# Patient Record
Sex: Female | Born: 1970 | Race: Black or African American | Hispanic: No | Marital: Married | Smoking: Never smoker
Health system: Southern US, Community
[De-identification: ages and names within clinical notes are randomized; demographics above are authoritative.]

## PROBLEM LIST (undated history)

## (undated) DIAGNOSIS — R0600 Dyspnea, unspecified: Secondary | ICD-10-CM

## (undated) DIAGNOSIS — R9431 Abnormal electrocardiogram [ECG] [EKG]: Secondary | ICD-10-CM

## (undated) DIAGNOSIS — R079 Chest pain, unspecified: Secondary | ICD-10-CM

## (undated) DIAGNOSIS — K219 Gastro-esophageal reflux disease without esophagitis: Secondary | ICD-10-CM

## (undated) DIAGNOSIS — D649 Anemia, unspecified: Secondary | ICD-10-CM

## (undated) DIAGNOSIS — D259 Leiomyoma of uterus, unspecified: Secondary | ICD-10-CM

## (undated) HISTORY — PX: NO PAST SURGERIES: SHX2092

## (undated) HISTORY — DX: Chest pain, unspecified: R07.9

## (undated) HISTORY — PX: UPPER GI ENDOSCOPY: SHX6162

## (undated) HISTORY — DX: Gastro-esophageal reflux disease without esophagitis: K21.9

## (undated) HISTORY — DX: Leiomyoma of uterus, unspecified: D25.9

## (undated) HISTORY — DX: Abnormal electrocardiogram (ECG) (EKG): R94.31

## (undated) HISTORY — DX: Dyspnea, unspecified: R06.00

## (undated) HISTORY — PX: COLONOSCOPY: SHX174

## (undated) HISTORY — DX: Anemia, unspecified: D64.9

---

## 2006-11-17 ENCOUNTER — Emergency Department (HOSPITAL_COMMUNITY): Admission: EM | Admit: 2006-11-17 | Discharge: 2006-11-17 | Payer: Self-pay | Admitting: Emergency Medicine

## 2012-09-07 DIAGNOSIS — R9431 Abnormal electrocardiogram [ECG] [EKG]: Secondary | ICD-10-CM

## 2012-09-07 HISTORY — DX: Abnormal electrocardiogram (ECG) (EKG): R94.31

## 2012-09-16 ENCOUNTER — Ambulatory Visit: Payer: BC Managed Care – PPO

## 2012-09-16 ENCOUNTER — Ambulatory Visit (INDEPENDENT_AMBULATORY_CARE_PROVIDER_SITE_OTHER): Payer: BC Managed Care – PPO | Admitting: Family Medicine

## 2012-09-16 VITALS — BP 123/80 | HR 69 | Temp 98.1°F | Resp 16 | Ht 63.75 in | Wt 141.0 lb

## 2012-09-16 DIAGNOSIS — R079 Chest pain, unspecified: Secondary | ICD-10-CM

## 2012-09-16 DIAGNOSIS — R0600 Dyspnea, unspecified: Secondary | ICD-10-CM

## 2012-09-16 DIAGNOSIS — R0609 Other forms of dyspnea: Secondary | ICD-10-CM

## 2012-09-16 LAB — COMPREHENSIVE METABOLIC PANEL
ALT: 14 U/L (ref 0–35)
AST: 15 U/L (ref 0–37)
Albumin: 4.1 g/dL (ref 3.5–5.2)
Alkaline Phosphatase: 49 U/L (ref 39–117)
BUN: 16 mg/dL (ref 6–23)
CO2: 28 mEq/L (ref 19–32)
Calcium: 9.3 mg/dL (ref 8.4–10.5)
Chloride: 103 mEq/L (ref 96–112)
Creat: 0.75 mg/dL (ref 0.50–1.10)
Glucose, Bld: 71 mg/dL (ref 70–99)
Potassium: 4.3 mEq/L (ref 3.5–5.3)
Sodium: 142 mEq/L (ref 135–145)
Total Bilirubin: 0.3 mg/dL (ref 0.3–1.2)
Total Protein: 7.2 g/dL (ref 6.0–8.3)

## 2012-09-16 LAB — POCT CBC
Granulocyte percent: 47.5 %G (ref 37–80)
HCT, POC: 41.8 % (ref 37.7–47.9)
Hemoglobin: 12.5 g/dL (ref 12.2–16.2)
Lymph, poc: 1.9 (ref 0.6–3.4)
MCH, POC: 27.2 pg (ref 27–31.2)
MCHC: 29.9 g/dL — AB (ref 31.8–35.4)
MCV: 90.8 fL (ref 80–97)
MID (cbc): 0.4 (ref 0–0.9)
MPV: 10.3 fL (ref 0–99.8)
POC Granulocyte: 2.1 (ref 2–6.9)
POC LYMPH PERCENT: 43 %L (ref 10–50)
POC MID %: 9.5 %M (ref 0–12)
Platelet Count, POC: 245 10*3/uL (ref 142–424)
RBC: 4.6 M/uL (ref 4.04–5.48)
RDW, POC: 14.4 %
WBC: 4.5 10*3/uL — AB (ref 4.6–10.2)

## 2012-09-16 LAB — TSH: TSH: 0.825 u[IU]/mL (ref 0.350–4.500)

## 2012-09-16 NOTE — Progress Notes (Signed)
  Subjective:    Patient ID: Cathy Hernandez, female    DOB: Feb 13, 1971, 40 y.o.   MRN: [id number]  HPI 41 year old female presents with 5 day history of perception of SOB that is intermittent in nature. States it has never been painful or severe enough to make her go to the ER, but states it has persisted and so she decided to get it checked out. It has never woken her from sleep.  She feels like it is probably a URI, but she denies nasal congestion, cough, sore throat, chest congestion, or otalgia.  No fever, chills, headache, dizziness, vision changes, abdominal pain, nausea, or vomiting. She is otherwise healthy with no known medical problems or daily medications.  She has never been a smoker.  Describes this as a chest tightness but no specific pain.  No history of GERD.  Denies palpitations, heart racing or skipping, or syncope.      Review of Systems  Constitutional: Negative.   HENT: Negative for congestion, sore throat, rhinorrhea and postnasal drip.   Respiratory: Positive for chest tightness and shortness of breath. Negative for cough and wheezing.   Cardiovascular: Positive for chest pain.  Neurological: Negative for dizziness, syncope and headaches.  All other systems reviewed and are negative.       Objective:   Physical Exam  Constitutional: She is oriented to person, place, and time. She appears well-developed and well-nourished.  HENT:  Head: Normocephalic and atraumatic.  Right Ear: Hearing, tympanic membrane, external ear and ear canal normal.  Left Ear: Hearing, tympanic membrane, external ear and ear canal normal.  Mouth/Throat: Uvula is midline, oropharynx is clear and moist and mucous membranes are normal. No oropharyngeal exudate.  Eyes: Conjunctivae normal and EOM are normal.  Neck: Normal range of motion.  Cardiovascular: Normal rate, regular rhythm and normal heart sounds.   Pulmonary/Chest: Effort normal and breath sounds normal.  Musculoskeletal:       No  pain to palpation of chest wall  Lymphadenopathy:    She has no cervical adenopathy.  Neurological: She is alert and oriented to person, place, and time.  Psychiatric: She has a normal mood and affect. Her behavior is normal. Judgment and thought content normal.     ECG read by Dr. Neva Seat as small q wave in II, nonspecific T waves in II, III, AVF, V2 and V3.    [facility] reading (PRIMARY) by  Dr. Neva Seat as normal chest x-ray.      Assessment & Plan:   1. Dyspnea  DG Chest 2 View, POCT CBC, TSH, Comprehensive metabolic panel, EKG 12-Lead  2. Chest pain  POCT CBC, TSH, Comprehensive metabolic panel, EKG 12-Lead, Ambulatory referral to Cardiology   Await labs Refer to Cardiology for further evaluation and work up if needed Follow up or go to ER with any worsening chest pain or dyspnea.

## 2012-09-18 ENCOUNTER — Encounter: Payer: Self-pay | Admitting: Cardiology

## 2012-09-18 ENCOUNTER — Ambulatory Visit (INDEPENDENT_AMBULATORY_CARE_PROVIDER_SITE_OTHER): Payer: BC Managed Care – PPO | Admitting: Cardiology

## 2012-09-18 VITALS — BP 116/76 | HR 71 | Ht 64.0 in | Wt 140.8 lb

## 2012-09-18 DIAGNOSIS — R9431 Abnormal electrocardiogram [ECG] [EKG]: Secondary | ICD-10-CM

## 2012-09-18 DIAGNOSIS — R0989 Other specified symptoms and signs involving the circulatory and respiratory systems: Secondary | ICD-10-CM

## 2012-09-18 DIAGNOSIS — R0609 Other forms of dyspnea: Secondary | ICD-10-CM

## 2012-09-18 DIAGNOSIS — R0600 Dyspnea, unspecified: Secondary | ICD-10-CM | POA: Insufficient documentation

## 2012-09-18 DIAGNOSIS — R079 Chest pain, unspecified: Secondary | ICD-10-CM

## 2012-09-18 NOTE — Progress Notes (Signed)
   HPI  The patient is seen for the assessment of chest heaviness and some shortness of breath. She is referred for evaluation by primary care. Patient is active and healthy. She has no significant risk factors for coronary disease. Recently at rest she has noted a slight heaviness in her chest. Also there has been associated slight shortness of breath. She does not have this symptom with exertion. She felt this several weeks ago and then it became more frequent. Today she has very slight ongoing heaviness in her chest. She wonders if she's had an upper respiratory infection, but she has had no other signs or symptoms. When she was seen by primary care EKG revealed inverted T waves in V1 V2 and V3. Chest x-ray was normal. Hemoglobin was normal and chemistries were normal and TSH was normal.  As part of today's evaluation I have carefully reviewed the office note from Dr. Neva Seat. I have reviewed all the labs also.  No Known Allergies  No current outpatient prescriptions on file.    History   Social History  . Marital Status: Married    Spouse Name: N/A    Number of Children: N/A  . Years of Education: N/A   Occupational History  . Not on file.   Social History Main Topics  . Smoking status: Never Smoker   . Smokeless tobacco: Not on file  . Alcohol Use: No  . Drug Use: No  . Sexually Active: Yes    Birth Control/ Protection: None   Other Topics Concern  . Not on file   Social History Narrative  . No narrative on file    Family History  Problem Relation Age of Onset  . Cirrhosis Mother   . Heart disease Maternal Grandmother     Past Medical History  Diagnosis Date  . Chest pain     December, 2013  . Dyspnea   . Abnormal EKG     December, 2013    No past surgical history on file.  Patient Active Problem List  Diagnosis  . Abnormal EKG  . Chest pain  . Dyspnea    ROS   Patient denies fever, chills, headache, sweats, rash, change in vision, change in hearing,  cough, nausea vomiting, urinary symptoms. All other systems are reviewed and are negative.  PHYSICAL EXAM  Patient is oriented to person time and place. Affect is normal. There no carotid bruits. There is no jugulovenous distention. Lungs are clear. Respiratory effort is nonlabored. Cardiac exam reveals S1 and S2. There no clicks or significant murmurs. The abdomen is soft. Is no peripheral edema. There are no musculoskeletal deformities. There are no skin rashes. O2 sat is 99%.  Filed Vitals:   09/18/12 1046  BP: 116/76  Pulse: 71  Height: 5\' 4"  (1.626 m)  Weight: 140 lb 12.8 oz (63.866 kg)  SpO2: 99%    I have reviewed the EKG that was done on December 10. A second EKG is done today and reviewed by me. There is slight J-point elevation in V2. T waves inverted in V3. Otherwise there diffuse mild nonspecific ST-T wave changes.  ASSESSMENT & PLAN

## 2012-09-18 NOTE — Assessment & Plan Note (Signed)
The patient describes slight chest heaviness. She does have nonspecific ST-T wave changes. We will proceed with a stress echo to be sure that her resting LV function is normal and that she does not have any unexpected stress-induced abnormalities.

## 2012-09-18 NOTE — Patient Instructions (Addendum)
Your physician recommends that you return for lab work in: today (d-dimer)  Your physician has requested that you have a stress echocardiogram. For further information please visit [email or web address] Please follow instruction sheet as given.  Your physician recommends that you schedule a follow-up appointment in: only if needed, no follow up planned at this time.  We will call  You with your test results.

## 2012-09-18 NOTE — Assessment & Plan Note (Signed)
The patient does have diffuse nonspecific ST-T wave changes that are mild. Today's EKG looks similar to the EKG of 2 days ago. It will be helpful to obtain echo data to be sure the resting LV function is normal and that there is no sign of ischemia with stress. Oh be in touch with her with the information and we will decide about followup visiting based on the data.

## 2012-09-18 NOTE — Assessment & Plan Note (Signed)
The patient's O2 sat is excellent. Chest x-ray revealed no acute abnormalities. At this point I am not convinced of a significant cardiopulmonary abnormality causing slight shortness of breath. It seems very unlikely that she's had a pulmonary embolus. However d-dimer will be checked.

## 2012-09-19 NOTE — Progress Notes (Signed)
Xray read and patient discussed with Ms. Marte. Agree with assessment and plan of care per her note.   

## 2012-09-30 ENCOUNTER — Ambulatory Visit (HOSPITAL_COMMUNITY): Payer: BC Managed Care – PPO | Attending: Cardiology | Admitting: Radiology

## 2012-09-30 ENCOUNTER — Encounter: Payer: Self-pay | Admitting: Cardiology

## 2012-09-30 ENCOUNTER — Ambulatory Visit (HOSPITAL_COMMUNITY): Payer: BC Managed Care – PPO | Attending: Cardiology

## 2012-09-30 DIAGNOSIS — R9431 Abnormal electrocardiogram [ECG] [EKG]: Secondary | ICD-10-CM

## 2012-09-30 DIAGNOSIS — R0600 Dyspnea, unspecified: Secondary | ICD-10-CM

## 2012-09-30 DIAGNOSIS — R0609 Other forms of dyspnea: Secondary | ICD-10-CM | POA: Insufficient documentation

## 2012-09-30 DIAGNOSIS — R072 Precordial pain: Secondary | ICD-10-CM

## 2012-09-30 DIAGNOSIS — R0989 Other specified symptoms and signs involving the circulatory and respiratory systems: Secondary | ICD-10-CM | POA: Insufficient documentation

## 2012-09-30 DIAGNOSIS — R079 Chest pain, unspecified: Secondary | ICD-10-CM

## 2012-09-30 NOTE — Progress Notes (Signed)
Echocardiogram performed.  

## 2012-10-12 ENCOUNTER — Encounter: Payer: Self-pay | Admitting: Cardiology

## 2012-10-27 ENCOUNTER — Encounter: Payer: Self-pay | Admitting: Cardiology

## 2012-10-27 NOTE — Progress Notes (Signed)
   The patient's stress echo was normal. We communicated this to her. She feels fine. No further workup is needed.  Jerral Bonito, MD

## 2013-12-16 ENCOUNTER — Ambulatory Visit (INDEPENDENT_AMBULATORY_CARE_PROVIDER_SITE_OTHER): Payer: BC Managed Care – PPO | Admitting: Family Medicine

## 2013-12-16 VITALS — BP 130/62 | HR 83 | Temp 99.3°F | Resp 16 | Ht 63.5 in | Wt 141.0 lb

## 2013-12-16 DIAGNOSIS — Z9109 Other allergy status, other than to drugs and biological substances: Secondary | ICD-10-CM

## 2013-12-16 DIAGNOSIS — R350 Frequency of micturition: Secondary | ICD-10-CM

## 2013-12-16 DIAGNOSIS — Z833 Family history of diabetes mellitus: Secondary | ICD-10-CM

## 2013-12-16 DIAGNOSIS — D649 Anemia, unspecified: Secondary | ICD-10-CM

## 2013-12-16 DIAGNOSIS — R0602 Shortness of breath: Secondary | ICD-10-CM

## 2013-12-16 LAB — GLUCOSE, POCT (MANUAL RESULT ENTRY): POC GLUCOSE: 96 mg/dL (ref 70–99)

## 2013-12-16 LAB — POCT UA - MICROSCOPIC ONLY
Casts, Ur, LPF, POC: NEGATIVE
Crystals, Ur, HPF, POC: NEGATIVE
MUCUS UA: NEGATIVE
RBC, URINE, MICROSCOPIC: NEGATIVE
YEAST UA: NEGATIVE

## 2013-12-16 LAB — POCT CBC
Granulocyte percent: 71.2 %G (ref 37–80)
HCT, POC: 32.4 % — AB (ref 37.7–47.9)
HEMOGLOBIN: 10 g/dL — AB (ref 12.2–16.2)
LYMPH, POC: 0.7 (ref 0.6–3.4)
MCH: 27.2 pg (ref 27–31.2)
MCHC: 30.9 g/dL — AB (ref 31.8–35.4)
MCV: 88.2 fL (ref 80–97)
MID (CBC): 0.4 (ref 0–0.9)
MPV: 9.2 fL (ref 0–99.8)
PLATELET COUNT, POC: 222 10*3/uL (ref 142–424)
POC GRANULOCYTE: 2.7 (ref 2–6.9)
POC LYMPH %: 18.8 % (ref 10–50)
POC MID %: 10 % (ref 0–12)
RBC: 3.67 M/uL — AB (ref 4.04–5.48)
RDW, POC: 14.4 %
WBC: 3.8 10*3/uL — AB (ref 4.6–10.2)

## 2013-12-16 LAB — POCT URINALYSIS DIPSTICK
Bilirubin, UA: NEGATIVE
Blood, UA: NEGATIVE
GLUCOSE UA: NEGATIVE
KETONES UA: NEGATIVE
LEUKOCYTES UA: NEGATIVE
Nitrite, UA: NEGATIVE
PROTEIN UA: NEGATIVE
SPEC GRAV UA: 1.015
Urobilinogen, UA: 0.2
pH, UA: 8

## 2013-12-16 MED ORDER — TRIAMCINOLONE ACETONIDE 55 MCG/ACT NA AERO: 2.0000 | INHALATION_SPRAY | Freq: Every day | NASAL | Status: DC

## 2013-12-16 MED ORDER — LANSOPRAZOLE 30 MG PO CPDR: 30.0000 mg | DELAYED_RELEASE_CAPSULE | Freq: Every day | ORAL | Status: DC

## 2013-12-16 NOTE — Patient Instructions (Addendum)
Prenatal vitamin with iron. Start with intranasal steroid spray for 3-4 weeks if it helps continue it if it does not start the PPI (med for heartburn) - if neither helps go to pulm appt which we will schedule that for about 2 months from today. Recheck blood count in about 2 weeks - before your menses

## 2013-12-16 NOTE — Progress Notes (Signed)
Subjective:    Patient ID: Cathy Hernandez, female    DOB: 1971/06/10, 43 y.o.   MRN: [id number]  HPI Pt presents to clinic with continued sensation of shortness of breath in her throat and chest, she was last seen 09/2012 and was having similar problems for months before that.  She does not have chest pain or chest tightness with breathing.  She had a full cardiac work-up 09/2012 which included a normal stress echo and the sensation has not changed since then at all.  It happens multiple times daily but is intermittent - there are times she feels like her breathing is completely normal.  She feels like the SOB is from her chest and not her nose.  She has intermittent nasal congestion.  She does not feel like the sensation is related to anything and she does not feel like anything makes it worse or better.  She has had nasal surgery in the past for something due to congestion.  She has a menses monthly that she does not feel is heavy.  She has no blood in her stool that she has noticed.  She is not weaker than normal but she does not work out regularly because she is afraid the SOB will get worse but she does not get increased symptoms with walking up stairs or other daily activities.  She has no symptoms of heartburn.  She is also noticing increase urination and wants to make sure she does not have diabetes because of a family history.  Review of Systems  Constitutional: Negative for fever and chills.  HENT: Negative for congestion, postnasal drip, rhinorrhea and sore throat.   Respiratory: Positive for chest tightness and shortness of breath. Negative for cough and wheezing.        No h/o asthma, no family history of asthma, no smoking in history  Endocrine: Positive for polyuria. Negative for polydipsia.  Genitourinary: Positive for frequency. Negative for dysuria, vaginal discharge and menstrual problem.       Objective:   Physical Exam  Vitals reviewed. Constitutional: She is oriented  to person, place, and time. She appears well-developed and well-nourished.  HENT:  Head: Normocephalic and atraumatic.  Right Ear: Hearing, tympanic membrane, external ear and ear canal normal.  Left Ear: Hearing, tympanic membrane, external ear and ear canal normal.  Nose: Mucosal edema (pale - right>left) present.  Mouth/Throat: Uvula is midline, oropharynx is clear and moist and mucous membranes are normal.  Eyes: Conjunctivae are normal.  Neck: Normal range of motion. Neck supple.  Cardiovascular: Normal rate, regular rhythm and normal heart sounds.   No murmur heard. Pulmonary/Chest: Effort normal and breath sounds normal. No accessory muscle usage. No respiratory distress. She has no wheezes.  Pt speaks in full sentences and appears comfortable - pt does not appear to have any SOB during her visit.  Lymphadenopathy:    She has no cervical adenopathy.  Neurological: She is alert and oriented to person, place, and time.  Skin: Skin is warm and dry.  Psychiatric: She has a normal mood and affect. Her behavior is normal. Judgment and thought content normal.   Results for orders placed in visit on 12/16/13  POCT CBC      Result Value Ref Range   WBC 3.8 (*) 4.6 - 10.2 K/uL   Lymph, poc 0.7  0.6 - 3.4   POC LYMPH PERCENT 18.8  10 - 50 %L   MID (cbc) 0.4  0 - 0.9   POC MID %  10.0  0 - 12 %M   POC Granulocyte 2.7  2 - 6.9   Granulocyte percent 71.2  37 - 80 %G   RBC 3.67 (*) 4.04 - 5.48 M/uL   Hemoglobin 10.0 (*) 12.2 - 16.2 g/dL   HCT, POC 32.4 (*) 37.7 - 47.9 %   MCV 88.2  80 - 97 fL   MCH, POC 27.2  27 - 31.2 pg   MCHC 30.9 (*) 31.8 - 35.4 g/dL   RDW, POC 14.4     Platelet Count, POC 222  142 - 424 K/uL   MPV 9.2  0 - 99.8 fL  GLUCOSE, POCT (MANUAL RESULT ENTRY)      Result Value Ref Range   POC Glucose 96  70 - 99 mg/dl  POCT URINALYSIS DIPSTICK      Result Value Ref Range   Color, UA yellow     Clarity, UA cloudy     Glucose, UA neg     Bilirubin, UA neg     Ketones,  UA neg     Spec Grav, UA 1.015     Blood, UA neg     pH, UA 8.0     Protein, UA neg     Urobilinogen, UA 0.2     Nitrite, UA neg     Leukocytes, UA Negative    POCT UA - MICROSCOPIC ONLY      Result Value Ref Range   WBC, Ur, HPF, POC 0-1     RBC, urine, microscopic neg     Bacteria, U Microscopic trace     Mucus, UA neg     Epithelial cells, urine per micros 2-6     Crystals, Ur, HPF, POC neg     Casts, Ur, LPF, POC neg     Yeast, UA neg     Reviewed her cardiac work-up, EKG and chest xray from her 09/2012 evaluation.    Assessment & Plan:  SOB (shortness of breath) - Due to a normal cardiac work-up without change in her symptoms since 09/2012 other causes were evaluated today.  Her spirometry was a normal ready but her FEV and FCV were both 81% which is lower than I would expect for her.  Due to the normal reading and her nasal congestion on exam we will start treatment for possible allergies with a 3 week trial of Nasacort and if that does not work we will do a 3 week trial of PPI - if there is no improvement will do a referral for pulmo to be done after a 6 week trial of the above.  Plan: POCT CBC, COMPLETE METABOLIC PANEL WITH GFR, TSH, lansoprazole (PREVACID) 30 MG capsule, Ambulatory referral to Pulmonology  Urinary frequency - Normal today - most likely related in water intake.  Plan: POCT urinalysis dipstick, POCT UA - Microscopic Only  Family history of diabetes mellitus - Plan: POCT glucose (manual entry)  Environmental allergies - Plan: triamcinolone (NASACORT AQ) 55 MCG/ACT AERO nasal inhaler  Low hemoglobin and low hematocrit - pt will RTC in 2 weeks prior to her menses.  She will also start a PNV with iron to see if that will help her energy level. If still low at the recheck we will do stool cards.  Plan: CBC  Windell Hummingbird PA-C 12/16/2013 7:43 PM  D/w Dr Carlota Raspberry

## 2013-12-17 LAB — COMPLETE METABOLIC PANEL WITH GFR
ALK PHOS: 49 U/L (ref 39–117)
ALT: 14 U/L (ref 0–35)
AST: 15 U/L (ref 0–37)
Albumin: 3.9 g/dL (ref 3.5–5.2)
BILIRUBIN TOTAL: 0.3 mg/dL (ref 0.2–1.2)
BUN: 13 mg/dL (ref 6–23)
CALCIUM: 8.9 mg/dL (ref 8.4–10.5)
CO2: 28 mEq/L (ref 19–32)
CREATININE: 0.84 mg/dL (ref 0.50–1.10)
Chloride: 102 mEq/L (ref 96–112)
GFR, Est African American: >89 mL/min
GFR, Est Non African American: 87 mL/min
Glucose, Bld: 89 mg/dL (ref 70–99)
Potassium: 4.2 mEq/L (ref 3.5–5.3)
Sodium: 137 mEq/L (ref 135–145)
Total Protein: 7 g/dL (ref 6.0–8.3)

## 2013-12-17 LAB — TSH: TSH: 1.08 u[IU]/mL (ref 0.350–4.500)

## 2013-12-17 NOTE — Progress Notes (Signed)
Spirometry read and patient discussed with Ms. Weber. Agree with assessment and plan of care per her note.

## 2013-12-21 ENCOUNTER — Institutional Professional Consult (permissible substitution): Payer: BC Managed Care – PPO | Admitting: Internal Medicine

## 2014-01-27 ENCOUNTER — Encounter: Payer: Self-pay | Admitting: Family Medicine

## 2014-01-27 ENCOUNTER — Ambulatory Visit (INDEPENDENT_AMBULATORY_CARE_PROVIDER_SITE_OTHER): Payer: BC Managed Care – PPO | Admitting: Family Medicine

## 2014-01-27 VITALS — BP 124/64 | HR 80 | Temp 98.9°F | Resp 16 | Ht 61.0 in | Wt 144.4 lb

## 2014-01-27 DIAGNOSIS — D649 Anemia, unspecified: Secondary | ICD-10-CM

## 2014-01-27 DIAGNOSIS — J309 Allergic rhinitis, unspecified: Secondary | ICD-10-CM

## 2014-01-27 LAB — POCT CBC
GRANULOCYTE PERCENT: 59.5 % (ref 37–80)
HCT, POC: 37.4 % — AB (ref 37.7–47.9)
HEMOGLOBIN: 11.6 g/dL — AB (ref 12.2–16.2)
Lymph, poc: 2.3 (ref 0.6–3.4)
MCH, POC: 27.3 pg (ref 27–31.2)
MCHC: 31 g/dL — AB (ref 31.8–35.4)
MCV: 87.9 fL (ref 80–97)
MID (cbc): 0.5 (ref 0–0.9)
MPV: 10 fL (ref 0–99.8)
PLATELET COUNT, POC: 266 10*3/uL (ref 142–424)
POC Granulocyte: 4 (ref 2–6.9)
POC LYMPH PERCENT: 33.5 %L (ref 10–50)
POC MID %: 7 %M (ref 0–12)
RBC: 4.25 M/uL (ref 4.04–5.48)
RDW, POC: 14.5 %
WBC: 6.8 10*3/uL (ref 4.6–10.2)

## 2014-01-27 NOTE — Progress Notes (Signed)
Subjective:   This chart was scribed for [name], MD by Forrestine Him, [facility]. This patient was seen in room 2 and the patient's care was started 6:42 PM.    Patient ID: Cathy Hernandez, female    DOB: 13-Oct-1970, 42 y.o.   MRN: [id number]  HPI  HPI Comments: Cathy Hernandez is a 43 y.o. female who presents to [facility] for anemia follow-up today. Initial visit 3/11 for SOB. States she has noted improvement and has been using her nasal spray has needed. Denies any follow-up with pulmonology. Did not need acid blocker.   Pt is taking her iron supplement about 3 days a week; No side effects at this time. LNMP March 29; admits to typical heavy periods. Last Hemoglobin during initial visit was 10.0 with an MCV of 88. Blood count today is 11.6. At this time she denies any diarrhea, tarry stool, dizziness, nausea, vomiting, SOB, or light-headedness. No pertinent medical history. No other concerns this visit.  Patient Active Problem List   Diagnosis Date Noted  . Abnormal EKG   . Chest pain   . Dyspnea    Past Medical History  Diagnosis Date  . Chest pain     December, 2013  . Dyspnea   . Abnormal EKG     December, 2013   History reviewed. No pertinent past surgical history. No Known Allergies Prior to Admission medications   Medication Sig Start Date End Date Taking? Authorizing Provider  triamcinolone (NASACORT AQ) 55 MCG/ACT AERO nasal inhaler Place 2 sprays into the nose daily. 12/16/13  Yes Mancel Bale, PA-C  lansoprazole (PREVACID) 30 MG capsule Take 1 capsule (30 mg total) by mouth daily at 12 noon. 12/16/13   Mancel Bale, PA-C   History   Social History  . Marital Status: Married    Spouse Name: N/A    Number of Children: N/A  . Years of Education: N/A   Occupational History  . Not on file.   Social History Main Topics  . Smoking status: Never Smoker   . Smokeless tobacco: Not on file  . Alcohol Use: No  .  Drug Use: No  . Sexual Activity: Yes    Birth Control/ Protection: None   Other Topics Concern  . Not on file   Social History Narrative  . No narrative on file     Review of Systems  Constitutional: Negative for fatigue and unexpected weight change.  Respiratory: Negative for chest tightness and shortness of breath.   Cardiovascular: Negative for chest pain, palpitations and leg swelling.  Gastrointestinal: Negative for nausea, vomiting, abdominal pain and blood in stool.  Neurological: Negative for dizziness, syncope, light-headedness and headaches.    Filed Vitals:   01/27/14 1720  BP: 124/64  Pulse: 80  Temp: 98.9 F (37.2 C)  TempSrc: Oral  Resp: 16  Height: 5\' 1"  (1.549 m)  Weight: 144 lb 6.4 oz (65.499 kg)  SpO2: 100%    Objective:   Physical Exam  Vitals reviewed. Constitutional: She is oriented to person, place, and time. She appears well-developed and well-nourished.  HENT:  Head: Normocephalic and atraumatic.  Eyes: Conjunctivae and EOM are normal. Pupils are equal, round, and reactive to light.  Neck: Carotid bruit is not present.  Cardiovascular: Normal rate, regular rhythm, normal heart sounds and intact distal pulses.   Pulmonary/Chest: Effort normal and breath sounds normal.  Abdominal: Soft. She exhibits no pulsatile midline mass. There is  no tenderness.  Neurological: She is alert and oriented to person, place, and time.  Skin: Skin is warm and dry.  Psychiatric: She has a normal mood and affect. Her behavior is normal.   Results for orders placed in visit on 01/27/14  POCT CBC      Result Value Ref Range   WBC 6.8  4.6 - 10.2 K/uL   Lymph, poc 2.3  0.6 - 3.4   POC LYMPH PERCENT 33.5  10 - 50 %L   MID (cbc) 0.5  0 - 0.9   POC MID % 7.0  0 - 12 %M   POC Granulocyte 4.0  2 - 6.9   Granulocyte percent 59.5  37 - 80 %G   RBC 4.25  4.04 - 5.48 M/uL   Hemoglobin 11.6 (*) 12.2 - 16.2 g/dL   HCT, POC 37.4 (*) 37.7 - 47.9 %   MCV 87.9  80 - 97 fL    MCH, POC 27.3  27 - 31.2 pg   MCHC 31.0 (*) 31.8 - 35.4 g/dL   RDW, POC 14.5     Platelet Count, POC 266  142 - 424 K/uL   MPV 10.0  0 - 99.8 fL     Assessment & Plan:  Cathy Hernandez is a 43 y.o. female Anemia - Plan: POCT CBC  Asymptomatic.  Improved reading today.  AUB/heavy menses possible, but not apparently different recently. Increase to QD dosing of PNV with iron (tips for compliance discussed) and recehck with Windell Hummingbird, PA-C in 3 months at appt. Sooner if new symptoms.   Allergic rhinitis - has nasocort if needed. No further dyspnea sx's. Cont sx care of allergies - especially as in middle of allergy season now. rtc precautions.    No orders of the defined types were placed in this encounter.   Patient Instructions  Continue nasal spray as needed for allergies and increase iron pill to once per day - try placing next to tooth brush to help remember dosing. Follow up with Judson Roch in 3 months - we will call you to schedule this.   Return to the clinic or go to the nearest emergency room if any of your symptoms worsen or new symptoms occur.   I personally performed the services described in this documentation, which was scribed in my presence. The recorded information has been reviewed and is accurate.

## 2014-01-27 NOTE — Patient Instructions (Signed)
Continue nasal spray as needed for allergies and increase iron pill to once per day - try placing next to tooth brush to help remember dosing. Follow up with Judson Roch in 3 months - we will call you to schedule this.   Return to the clinic or go to the nearest emergency room if any of your symptoms worsen or new symptoms occur.

## 2014-01-28 ENCOUNTER — Institutional Professional Consult (permissible substitution): Payer: BC Managed Care – PPO | Admitting: Internal Medicine

## 2014-02-10 NOTE — Progress Notes (Signed)
Called to schedule appt left message for her to call back

## 2014-02-17 ENCOUNTER — Telehealth: Payer: Self-pay | Admitting: Family Medicine

## 2014-02-17 NOTE — Progress Notes (Signed)
LMOM to CB. 

## 2014-02-23 NOTE — Telephone Encounter (Signed)
LMOM to CB. 

## 2014-07-12 ENCOUNTER — Institutional Professional Consult (permissible substitution): Payer: BC Managed Care – PPO | Admitting: Pulmonary Disease

## 2014-07-13 ENCOUNTER — Encounter: Payer: Self-pay | Admitting: Pulmonary Disease

## 2015-06-10 ENCOUNTER — Ambulatory Visit (INDEPENDENT_AMBULATORY_CARE_PROVIDER_SITE_OTHER): Payer: BC Managed Care – PPO | Admitting: Urgent Care

## 2015-06-10 VITALS — BP 120/80 | HR 86 | Temp 99.4°F | Resp 16 | Ht 64.0 in | Wt 152.0 lb

## 2015-06-10 DIAGNOSIS — Z0000 Encounter for general adult medical examination without abnormal findings: Secondary | ICD-10-CM | POA: Diagnosis not present

## 2015-06-10 DIAGNOSIS — H6121 Impacted cerumen, right ear: Secondary | ICD-10-CM

## 2015-06-10 DIAGNOSIS — Z111 Encounter for screening for respiratory tuberculosis: Secondary | ICD-10-CM

## 2015-06-10 DIAGNOSIS — Z23 Encounter for immunization: Secondary | ICD-10-CM

## 2015-06-10 DIAGNOSIS — Z800 Family history of malignant neoplasm of digestive organs: Secondary | ICD-10-CM

## 2015-06-10 NOTE — Progress Notes (Signed)
  Tuberculosis Risk Questionnaire  1. No Were you born outside the [place] in one of the following parts of the world: [place], [place], [place], [place] or [place]?    2. No Have you traveled outside the [place] and lived for more than one month in one of the following parts of the world: [place], [place], [place], [place] or [place]?    3. No Do you have a compromised immune system such as from any of the following conditions:HIV/AIDS, organ or bone marrow transplantation, diabetes, immunosuppressive medicines (e.g. Prednisone, Remicaide), leukemia, lymphoma, cancer of the head or neck, gastrectomy or jejunal bypass, end-stage renal disease (on dialysis), or silicosis?     4. No Have you ever or do you plan on working in: a residential care center, a health care facility, a jail or prison or homeless shelter?    5. No Have you ever: injected illegal drugs, used crack cocaine, lived in a homeless shelter  or been in jail or prison?     6. Yes  Have you ever been exposed to anyone with infectious tuberculosis?    Tuberculosis Symptom Questionnaire  Do you currently have any of the following symptoms?  1. No Unexplained cough lasting more than 3 weeks?   2. No Unexplained fever lasting more than 3 weeks.   3. No Night Sweats (sweating that leaves the bedclothes and sheets wet)     4. No Shortness of Breath   5. No Chest Pain   6. No Unintentional weight loss    7. No Unexplained fatigue (very tired for no reason)

## 2015-06-10 NOTE — Progress Notes (Signed)
MRN: [id number] DOB: [birth year]  Subjective:   Cathy Hernandez is a 44 y.o. female presenting for chief complaint of Annual Exam and PPD Placement  Patient is here for teacher physical and PPD placement. She is a Patent examiner, currently single, does not smoke cigarettes or drink alcohol. She exercises intermittently. Of note, her father passed away from colon cancer with metastasis. Was diagnosed with this twice in his lifetime. Patient would like referral to have colonoscopy done.  Denies any other aggravating or relieving factors, no other questions or concerns.  Cathy Hernandez currently has no medications in their medication list. Also has No Known Allergies.  Cathy Hernandez  has a past medical history of Chest pain; Dyspnea; and Abnormal EKG. Also  has no past surgical history on file.  Her family history includes Cancer in her father; Cirrhosis in her mother; Diabetes in her father and mother; Heart disease in her maternal grandmother; Hypertension in her father.  Review of Systems  Constitutional: Negative for fever, chills, weight loss, malaise/fatigue and diaphoresis.  HENT: Negative for congestion, ear discharge, ear pain, hearing loss, nosebleeds, sore throat and tinnitus.   Eyes: Negative for blurred vision, double vision, photophobia, pain, discharge and redness.  Respiratory: Negative for cough, shortness of breath and wheezing.   Cardiovascular: Negative for chest pain, palpitations and leg swelling.  Gastrointestinal: Negative for nausea, vomiting, abdominal pain, diarrhea, constipation and blood in stool.  Genitourinary: Negative for dysuria, urgency, frequency, hematuria and flank pain.  Musculoskeletal: Negative for myalgias, back pain and joint pain.  Skin: Negative for itching and rash.  Neurological: Negative for dizziness, tingling, seizures, loss of consciousness, weakness and headaches.  Endo/Heme/Allergies: Negative for polydipsia.  Psychiatric/Behavioral: Negative  for depression, suicidal ideas, hallucinations, memory loss and substance abuse. The patient is not nervous/anxious and does not have insomnia.    Objective:   Vitals: BP 120/80 mmHg  Pulse 86  Temp(Src) 99.4 F (37.4 C) (Oral)  Resp 16  Ht 5\' 4"  (1.626 m)  Wt 152 lb (68.947 kg)  BMI 26.08 kg/m2  SpO2 98%  LMP 05/18/2015  Physical Exam  Constitutional: She is oriented to person, place, and time. She appears well-developed and well-nourished.  HENT:  TM's intact bilaterally, no effusions or erythema. Nares patent, nasal turbinates pink and moist, nasal passages patent. No sinus tenderness. Oropharynx clear, mucous membranes moist, dentition in good repair.  Eyes: Conjunctivae and EOM are normal. Pupils are equal, round, and reactive to light. Right eye exhibits no discharge. Left eye exhibits no discharge. No scleral icterus.  Neck: Normal range of motion. Neck supple. No thyromegaly present.  Cardiovascular: Normal rate, regular rhythm and intact distal pulses.  Exam reveals no gallop and no friction rub.   No murmur heard. Pulmonary/Chest: No respiratory distress. She has no wheezes. She has no rales.  Abdominal: Soft. Bowel sounds are normal. She exhibits no distension and no mass. There is no tenderness.  Musculoskeletal: Normal range of motion. She exhibits no edema or tenderness.  Strength 5/5 throughout.  Lymphadenopathy:    She has no cervical adenopathy.  Neurological: She is alert and oriented to person, place, and time. She has normal reflexes.  Skin: Skin is warm and dry. No rash noted. No erythema. No pallor.  Psychiatric: She has a normal mood and affect.   Assessment and Plan :   1. Annual physical exam - Labs pending, patient is medically healthy - Discussed healthy lifestyle, diet, exercise, preventative care, vaccinations, and addressed patient's concerns.  -  CBC - Comprehensive metabolic panel - TSH - Lipid panel  2. Family history of colon cancer -  Referred for early screening due first degree family member history of colon cancer - Ambulatory referral to Gastroenterology  3. Cerumen impaction, right - Significantly improved s/p ear irrigation. RTC as needed.  4. Tuberculosis screening - RTC in 48-72 hours for PPD reading - TB Skin Test  5. Need for Tdap vaccination - Tdap vaccine greater than or equal to 7yo IM   Jaynee Eagles, PA-C [facility] [phone] 06/10/2015 5:19 PM

## 2015-06-10 NOTE — Patient Instructions (Addendum)
Keeping You Healthy  Get These Tests  Blood Pressure- Have your blood pressure checked once a year by your health care provider.  Normal blood pressure is 120/80.  Weight- Have your body mass index (BMI) calculated to screen for obesity.  BMI is measure of body fat based on height and weight.  You can also calculate your own BMI at GravelBags.it.  Cholesterol- Have your cholesterol checked every 5 years starting at age 43 then yearly starting at age 102.  Chlamydia, HIV, and other sexually transmitted diseases- Get screened every year until age 16, then within three months of each new sexual provider.  Pap Test - Every 1-5 years; discuss with your health care provider.  Mammogram- Every 1-2 years starting at age 52--50  Take these medicines  Calcium with Vitamin D-Your body needs 1200 mg of Calcium each day and [phone] IU of Vitamin D daily.  Your body can only absorb 500 mg of Calcium at a time so Calcium must be taken in 2 or 3 divided doses throughout the day.  Multivitamin with folic acid- Once daily if it is possible for you to become pregnant.  Get these Immunizations  Gardasil-Series of three doses; prevents HPV related illness such as genital warts and cervical cancer.  Menactra-Single dose; prevents meningitis.  Tetanus shot- Every 10 years.  Flu shot-Every year.  Take these steps  Do not smoke-Your healthcare provider can help you quit.  For tips on how to quit go to www.smokefree.gov or call 1-800 QUITNOW.  Be physically active- Exercise 5 days a week for at least 30 minutes.  If you are not already physically active, start slow and gradually work up to 30 minutes of moderate physical activity.  Examples of moderate activity include walking briskly, dancing, swimming, bicycling, etc.  Breast Cancer- A self breast exam every month is important for early detection of breast cancer.  For more information and instruction on self breast exams, ask your  healthcare provider or https://www.patel.info/.  Eat a healthy diet- Eat a variety of healthy foods such as fruits, vegetables, whole grains, low fat milk, low fat cheeses, yogurt, lean meats, poultry and fish, beans, nuts, tofu, etc.  For more information go to www. Thenutritionsource.org  Drink alcohol in moderation- Limit alcohol intake to one drink or less per day. Never drink and drive.  Depression- Your emotional health is as important as your physical health.  If you're feeling down or losing interest in things you normally enjoy please talk to your healthcare provider about being screened for depression.  Dental visit- Brush and floss your teeth twice daily; visit your dentist twice a year.  Eye doctor- Get an eye exam at least every 2 years.  Helmet use- Always wear a helmet when riding a bicycle, motorcycle, rollerblading or skateboarding.  Safe sex- If you may be exposed to sexually transmitted infections, use a condom.  Seat belts- Seat belts can save your live; always wear one.  Smoke/Carbon Monoxide detectors- These detectors need to be installed on the appropriate level of your home. Replace batteries at least once a year.  Skin cancer- When out in the sun please cover up and use sunscreen 15 SPF or higher.  Violence- If anyone is threatening or hurting you, please tell your healthcare provider.   Cerumen Impaction A cerumen impaction is when the wax in your ear forms a plug. This plug usually causes reduced hearing. Sometimes it also causes an earache or dizziness. Removing a cerumen impaction can be difficult  and painful. The wax sticks to the ear canal. The canal is sensitive and bleeds easily. If you try to remove a heavy wax buildup with a cotton tipped swab, you may push it in further. Irrigation with water, suction, and small ear curettes may be used to clear out the wax. If the impaction is fixed to the skin in the ear canal, ear drops may be  needed for a few days to loosen the wax. People who build up a lot of wax frequently can use ear wax removal products available in your local drugstore. SEEK MEDICAL CARE IF:  You develop an earache, increased hearing loss, or marked dizziness. Document Released: 11/01/2004 Document Revised: 12/17/2011 Document Reviewed: 12/22/2009 [facility] Patient Information 2015 [place], [place]. This information is not intended to replace advice given to you by your health care provider. Make sure you discuss any questions you have with your health care provider.    Tdap Vaccine (Tetanus, Diphtheria, Pertussis): What You Need to Know 1. Why get vaccinated? Tetanus, diphtheria and pertussis can be very serious diseases, even for adolescents and adults. Tdap vaccine can protect [place] from these diseases. TETANUS (Lockjaw) causes painful muscle tightening and stiffness, usually all over the body.  It can lead to tightening of muscles in the head and neck so you can't open your mouth, swallow, or sometimes even breathe. Tetanus kills about 1 out of 5 people who are infected. DIPHTHERIA can cause a thick coating to form in the back of the throat.  It can lead to breathing problems, paralysis, heart failure, and death. PERTUSSIS (Whooping Cough) causes severe coughing spells, which can cause difficulty breathing, vomiting and disturbed sleep.  It can also lead to weight loss, incontinence, and rib fractures. Up to 2 in 100 adolescents and 5 in 100 adults with pertussis are hospitalized or have complications, which could include pneumonia or death. These diseases are caused by bacteria. Diphtheria and pertussis are spread from person to person through coughing or sneezing. Tetanus enters the body through cuts, scratches, or wounds. Before vaccines, the [place] saw as many as 200,000 cases a year of diphtheria and pertussis, and hundreds of cases of tetanus. Since vaccination began, tetanus and diphtheria have  dropped by about 99% and pertussis by about 80%. 2. Tdap vaccine Tdap vaccine can protect adolescents and adults from tetanus, diphtheria, and pertussis. One dose of Tdap is routinely given at age 87 or 58. People who did not get Tdap at that age should get it as soon as possible. Tdap is especially important for health care professionals and anyone having close contact with a baby younger than 12 months. Pregnant women should get a dose of Tdap during every pregnancy, to protect the newborn from pertussis. Infants are most at risk for severe, life-threatening complications from pertussis. A similar vaccine, called Td, protects from tetanus and diphtheria, but not pertussis. A Td booster should be given every 10 years. Tdap may be given as one of these boosters if you have not already gotten a dose. Tdap may also be given after a severe cut or burn to prevent tetanus infection. Your doctor can give you more information. Tdap may safely be given at the same time as other vaccines. 3. Some people should not get this vaccine  If you ever had a life-threatening allergic reaction after a dose of any tetanus, diphtheria, or pertussis containing vaccine, OR if you have a severe allergy to any part of this vaccine, you should not get Tdap.  Tell your doctor if you have any severe allergies.  If you had a coma, or long or multiple seizures within 7 days after a childhood dose of DTP or DTaP, you should not get Tdap, unless a cause other than the vaccine was found. You can still get Td.  Talk to your doctor if you:  have epilepsy or another nervous system problem,  had severe pain or swelling after any vaccine containing diphtheria, tetanus or pertussis,  ever had Guillain-Barr Syndrome (GBS),  aren't feeling well on the day the shot is scheduled. 4. Risks of a vaccine reaction With any medicine, including vaccines, there is a chance of side effects. These are usually mild and go away on their own, but  serious reactions are also possible. Brief fainting spells can follow a vaccination, leading to injuries from falling. Sitting or lying down for about 15 minutes can help prevent these. Tell your doctor if you feel dizzy or light-headed, or have vision changes or ringing in the ears. Mild problems following Tdap (Did not interfere with activities)  Pain where the shot was given (about 3 in 4 adolescents or 2 in 3 adults)  Redness or swelling where the shot was given (about 1 person in 5)  Mild fever of at least 100.45F (up to about 1 in 25 adolescents or 1 in 100 adults)  Headache (about 3 or 4 people in 10)  Tiredness (about 1 person in 3 or 4)  Nausea, vomiting, diarrhea, stomach ache (up to 1 in 4 adolescents or 1 in 10 adults)  Chills, body aches, sore joints, rash, swollen glands (uncommon) Moderate problems following Tdap (Interfered with activities, but did not require medical attention)  Pain where the shot was given (about 1 in 5 adolescents or 1 in 100 adults)  Redness or swelling where the shot was given (up to about 1 in 16 adolescents or 1 in 25 adults)  Fever over 102F (about 1 in 100 adolescents or 1 in 250 adults)  Headache (about 3 in 20 adolescents or 1 in 10 adults)  Nausea, vomiting, diarrhea, stomach ache (up to 1 or 3 people in 100)  Swelling of the entire arm where the shot was given (up to about 3 in 100). Severe problems following Tdap (Unable to perform usual activities; required medical attention)  Swelling, severe pain, bleeding and redness in the arm where the shot was given (rare). A severe allergic reaction could occur after any vaccine (estimated less than 1 in a million doses). 5. What if there is a serious reaction? What should I look for?  Look for anything that concerns you, such as signs of a severe allergic reaction, very high fever, or behavior changes. Signs of a severe allergic reaction can include hives, swelling of the face and  throat, difficulty breathing, a fast heartbeat, dizziness, and weakness. These would start a few minutes to a few hours after the vaccination. What should I do?  If you think it is a severe allergic reaction or other emergency that can't wait, call 9-1-1 or get the person to the nearest hospital. Otherwise, call your doctor.  Afterward, the reaction should be reported to the "Vaccine Adverse Event Reporting System" (VAERS). Your doctor might file this report, or you can do it yourself through the VAERS web site at www.vaers.SamedayNews.es, or by calling (563)806-2299. VAERS is only for reporting reactions. They do not give medical advice.  6. The National Vaccine Injury Compensation Program The [employer] Vaccine Injury Compensation Program ([place])  is a federal program that was created to compensate people who may have been injured by certain vaccines. Persons who believe they may have been injured by a vaccine can learn about the program and about filing a claim by calling 709 369 4561 or visiting the [place] website at GoldCloset.com.ee. 7. How can I learn more?  Ask your doctor.  Call your local or state health department.  Contact the Centers for Disease Control and Prevention (CDC):  Call 512-515-0752 or visit CDC's website at http://hunter.com/. CDC Tdap Vaccine VIS (02/14/12) Document Released: 03/25/2012 Document Revised: 02/08/2014 Document Reviewed: 01/06/2014 ExitCare Patient Information 2015 [place], [place]. This information is not intended to replace advice given to you by your health care provider. Make sure you discuss any questions you have with your health care provider.

## 2015-06-11 LAB — COMPREHENSIVE METABOLIC PANEL
ALBUMIN: 4.3 g/dL (ref 3.6–5.1)
ALT: 17 U/L (ref 6–29)
AST: 15 U/L (ref 10–30)
Alkaline Phosphatase: 55 U/L (ref 33–115)
BUN: 14 mg/dL (ref 7–25)
CHLORIDE: 102 mmol/L (ref 98–110)
CO2: 25 mmol/L (ref 20–31)
Calcium: 9.6 mg/dL (ref 8.6–10.2)
Creat: 0.83 mg/dL (ref 0.50–1.10)
Glucose, Bld: 87 mg/dL (ref 65–99)
Potassium: 4.2 mmol/L (ref 3.5–5.3)
SODIUM: 135 mmol/L (ref 135–146)
Total Bilirubin: 0.2 mg/dL (ref 0.2–1.2)
Total Protein: 7.6 g/dL (ref 6.1–8.1)

## 2015-06-11 LAB — LIPID PANEL
CHOL/HDL RATIO: 2.2 ratio (ref <=5.0)
CHOLESTEROL: 144 mg/dL (ref 125–200)
HDL: 66 mg/dL (ref >=46)
LDL Cholesterol: 65 mg/dL (ref <130)
Triglycerides: 65 mg/dL (ref <150)
VLDL: 13 mg/dL (ref <30)

## 2015-06-11 LAB — CBC
HCT: 35.9 % — ABNORMAL LOW (ref 36.0–46.0)
Hemoglobin: 11.7 g/dL — ABNORMAL LOW (ref 12.0–15.0)
MCH: 27.5 pg (ref 26.0–34.0)
MCHC: 32.6 g/dL (ref 30.0–36.0)
MCV: 84.5 fL (ref 78.0–100.0)
MPV: 11 fL (ref 8.6–12.4)
PLATELETS: 285 10*3/uL (ref 150–400)
RBC: 4.25 MIL/uL (ref 3.87–5.11)
RDW: 15 % (ref 11.5–15.5)
WBC: 6.5 10*3/uL (ref 4.0–10.5)

## 2015-06-11 LAB — TSH: TSH: 1.027 u[IU]/mL (ref 0.350–4.500)

## 2015-06-12 ENCOUNTER — Encounter: Payer: Self-pay | Admitting: Urgent Care

## 2015-06-13 ENCOUNTER — Ambulatory Visit (INDEPENDENT_AMBULATORY_CARE_PROVIDER_SITE_OTHER): Payer: BC Managed Care – PPO

## 2015-06-13 DIAGNOSIS — Z7689 Persons encountering health services in other specified circumstances: Secondary | ICD-10-CM

## 2015-06-13 DIAGNOSIS — Z111 Encounter for screening for respiratory tuberculosis: Secondary | ICD-10-CM

## 2015-06-13 NOTE — Progress Notes (Signed)
   Subjective:    Patient ID: Cathy Hernandez, female    DOB: 03-05-1971, 43 y.o.   MRN: [id number]  HPI Pt here for PPD read only.   PPD negative. 55mm induration. Read by Frutoso Chase, R.T.(R)   Review of Systems     Objective:   Physical Exam        Assessment & Plan:

## 2016-01-07 ENCOUNTER — Emergency Department (HOSPITAL_COMMUNITY): Admission: EM | Admit: 2016-01-07 | Discharge: 2016-01-07 | Discharge status: Absent without leave | Payer: Self-pay

## 2016-01-08 ENCOUNTER — Ambulatory Visit (INDEPENDENT_AMBULATORY_CARE_PROVIDER_SITE_OTHER): Payer: BC Managed Care – PPO | Admitting: Osteopathic Medicine

## 2016-01-08 ENCOUNTER — Ambulatory Visit (INDEPENDENT_AMBULATORY_CARE_PROVIDER_SITE_OTHER): Payer: BC Managed Care – PPO

## 2016-01-08 VITALS — BP 108/66 | HR 73 | Temp 98.6°F | Resp 16 | Ht 64.0 in | Wt 153.8 lb

## 2016-01-08 DIAGNOSIS — R0609 Other forms of dyspnea: Secondary | ICD-10-CM

## 2016-01-08 DIAGNOSIS — K219 Gastro-esophageal reflux disease without esophagitis: Secondary | ICD-10-CM | POA: Diagnosis not present

## 2016-01-08 DIAGNOSIS — R011 Cardiac murmur, unspecified: Secondary | ICD-10-CM | POA: Insufficient documentation

## 2016-01-08 DIAGNOSIS — R0600 Dyspnea, unspecified: Secondary | ICD-10-CM

## 2016-01-08 LAB — TSH: TSH: 1.42 mIU/L

## 2016-01-08 LAB — CBC WITH DIFFERENTIAL/PLATELET
BASOS PCT: 0 % (ref 0–1)
Basophils Absolute: 0 10*3/uL (ref 0.0–0.1)
EOS PCT: 2 % (ref 0–5)
Eosinophils Absolute: 0.1 10*3/uL (ref 0.0–0.7)
HCT: 30.1 % — ABNORMAL LOW (ref 36.0–46.0)
Hemoglobin: 9.4 g/dL — ABNORMAL LOW (ref 12.0–15.0)
LYMPHS PCT: 22 % (ref 12–46)
Lymphs Abs: 1.3 10*3/uL (ref 0.7–4.0)
MCH: 24.3 pg — ABNORMAL LOW (ref 26.0–34.0)
MCHC: 31.2 g/dL (ref 30.0–36.0)
MCV: 77.8 fL — ABNORMAL LOW (ref 78.0–100.0)
MPV: 10.2 fL (ref 8.6–12.4)
Monocytes Absolute: 0.5 10*3/uL (ref 0.1–1.0)
Monocytes Relative: 8 % (ref 3–12)
NEUTROS PCT: 68 % (ref 43–77)
Neutro Abs: 3.9 10*3/uL (ref 1.7–7.7)
PLATELETS: 336 10*3/uL (ref 150–400)
RBC: 3.87 MIL/uL (ref 3.87–5.11)
RDW: 15.7 % — AB (ref 11.5–15.5)
WBC: 5.8 10*3/uL (ref 4.0–10.5)

## 2016-01-08 LAB — COMPREHENSIVE METABOLIC PANEL
ALT: 14 U/L (ref 6–29)
AST: 13 U/L (ref 10–30)
Albumin: 3.8 g/dL (ref 3.6–5.1)
Alkaline Phosphatase: 52 U/L (ref 33–115)
BILIRUBIN TOTAL: 0.3 mg/dL (ref 0.2–1.2)
BUN: 14 mg/dL (ref 7–25)
CO2: 28 mmol/L (ref 20–31)
CREATININE: 0.78 mg/dL (ref 0.50–1.10)
Calcium: 9.1 mg/dL (ref 8.6–10.2)
Chloride: 104 mmol/L (ref 98–110)
GLUCOSE: 79 mg/dL (ref 65–99)
Potassium: 3.7 mmol/L (ref 3.5–5.3)
SODIUM: 139 mmol/L (ref 135–146)
Total Protein: 6.9 g/dL (ref 6.1–8.1)

## 2016-01-08 MED ORDER — OMEPRAZOLE 20 MG PO CPDR: 20.0000 mg | DELAYED_RELEASE_CAPSULE | Freq: Two times a day (BID) | ORAL | Status: DC

## 2016-01-08 NOTE — Progress Notes (Signed)
HPI: Cathy Hernandez is a 45 y.o. female who presents to [facility] today for chief complaint of:  Chief Complaint  Patient presents with  . Fatigue    per patient feeling tired  . Shortness of Breath    per patient has to work harder when it is any physical activity     . Quality: fatigue, SOB. Feels like chest tightness. Patient has some difficulty describing exactly sensation, says it sort of feels like when she was diagnosed with acid reflux, feels like tightness rather than true shortness of breath but definitely notices herself getting winded on exertion . Severity: stable over past few weeks . Duration: over past few weeks feels SOB on exertion  . Timing: worse on exertion and lying down . Context: hx GERD and was doing well on antacids but has since stopped these. Hx anemia, previously on iron pills.  . Assoc signs/symptoms: Reports chest tightness but no chest pain/pressure, no dizziness, no loss of consciousness, no claudication, no cough   Past medical, social and family history reviewed: Past Medical History  Diagnosis Date  . Chest pain     December, 2013  . Dyspnea   . Abnormal EKG     December, 2013  . Anemia   . GERD (gastroesophageal reflux disease)    No past surgical history on file. Social History  Substance Use Topics  . Smoking status: Never Smoker   . Smokeless tobacco: Not on file  . Alcohol Use: No   Family History  Problem Relation Age of Onset  . Cirrhosis Mother   . Diabetes Mother   . Heart disease Maternal Grandmother   . Cancer Father   . Diabetes Father   . Hypertension Father     No current outpatient prescriptions on file.   No current facility-administered medications for this visit.   Not on File   Review of Systems: CONSTITUTIONAL:  No  fever, no chills HEAD/EYES/EARS/NOSE/THROAT: No  headache, no vision change CARDIAC: (+) chest tightness as per HPI. No  chest pain, No  pressure, No palpitations,  (+) orthopnea,  RESPIRATORY: No  cough, (+) shortness of breath as per HPI, no wheeze GASTROINTESTINAL: No  nausea, No  vomiting, No  abdominal pain SKIN: No  rash/wounds/concerning lesions HEM/ONC: No  easy bruising/bleeding, No  abnormal lymph node NEUROLOGIC: (+) generalized weakness, No  Dizziness, No LOC   Exam:  BP 108/66 mmHg  Pulse 73  Temp(Src) 98.6 F (37 C) (Oral)  Resp 16  Ht 5\' 4"  (1.626 m)  Wt 153 lb 12.8 oz (69.763 kg)  BMI 26.39 kg/m2  SpO2 99%  LMP 01/05/2016 Constitutional: VS see above. General Appearance: alert, well-developed, well-nourished, NAD Eyes: Normal lids and conjunctive, non-icteric sclera,  Ears, Nose, Mouth, Throat: MMM, Normal external inspection ears/nares/mouth/lips/gums, Neck: No masses, trachea midline. No thyroid enlargement/tenderness/mass appreciated. No lymphadenopathy Respiratory: Normal respiratory effort. no wheeze, no rhonchi, no rales Cardiovascular: S1/S2 normal, (+) 2/6 systolic murmur, no rub/gallop auscultated. RRR. No carotid bruit or JVD. No abdominal aortic bruit. Pedal pulse II/IV bilaterally DP and PT. No lower extremity edema. Gastrointestinal: Nontender, no masses. No hepatomegaly, no splenomegaly. No hernia appreciated. Bowel sounds normal. Rectal exam deferred.  Musculoskeletal: Gait normal. No clubbing/cyanosis of digits.  Skin: warm, dry, intact. No rash/ulcer. No concerning nevi or subq nodules on limited exam.   Psychiatric: Normal judgment/insight. Normal mood and affect.    Imaging  CXR personally reviewed, no concerns, see below for radiologist over-read.  Dg Chest 2 View  01/08/2016  CLINICAL DATA:  Dyspnea. EXAM: CHEST  2 VIEW COMPARISON:  September 16, 2012. FINDINGS: The heart size and mediastinal contours are within normal limits. Both lungs are clear. No pneumothorax or pleural effusion is noted. The visualized skeletal structures are unremarkable. IMPRESSION: No active cardiopulmonary disease. Electronically  Signed   By: Marijo Conception, M.D.   On: 01/08/2016 15:25    Brief review of previous records.   Last labs 7 months ago, mild anemia, CMP, TSH, lipids okay.  Stress echo results reviewed: normal, 10/12/12. Current symptoms similar.   EKG reviewed from 09/18/12: T waves abn III, V1, V3 Sinus rhythm  C/w EKG 09/16/12  EKG interpretation today 01/08/2016: Rate: 76 Rhythm: sinus Stable small Q in II ?New Q in III Persistent T wave inversion on aVR, V1, V3 New T wave inversion V2 No ST/T changes concerning for acute ischemia/infarct     ASSESSMENT/PLAN:   Relatively new onset of dyspnea on exertion, patient previously had cardiac workup in 2014 for some similar symptoms, treated for acid reflux which helped. Symptoms shortness of breath and finding of new heart murmur, however, are concerning. Labs as below, EKG chest x-ray reviewed in office, echocardiogram ordered.   Would recommend patient follow up with PCP to review results of these and recheck symptoms.   We'll also go ahead and put back on treatment for acid reflux.   Would consider possible PFT or cardiology referral.   ER/RTC precautions reviewed.  Dyspnea on exertion - EKG relatively stable from 2013, CXR and VS ok, no clinical CHF, ER/RTC prec reviewed, consider f/u cardio again - Plan: DG Chest 2 View, EKG 12-Lead, CBC with Differential/Platelet, Comprehensive metabolic panel, TSH, Brain natriuretic peptide, Echocardiogram, CANCELED: Brain natriuretic peptide  Newly recognized heart murmur - Echo ordered - Plan: Echocardiogram  Gastroesophageal reflux disease, esophagitis presence not specified - PPI 6-12 wks, plan to transition to H2B/consider EGD - Plan: omeprazole (PRILOSEC) 20 MG capsule    Visit summary printed and instructions reviewed with the patient. All questions answered. Return in about 2 weeks (around 01/22/2016), or sooner if needed, for RECHECK SYMPTOMS AND REVIEW RESULTS.

## 2016-01-08 NOTE — Patient Instructions (Addendum)
Several tests and studies were done today to further evaluate your symptoms. We didn't see anything emergent on EKG or Xray. We will call you with the results of lab tests once available, and you should hear about scheduling an echocardiogram as well.  I recommend that you establish care with a primary care physician to monitor your symptoms and perform any other further workup as needed/as indicated by your other results, and plan to follow up with this doctor in the next 1- 2 weeks, or after your echocardiogram. Thanks for coming in today, and take care!  -Dr. Loni Muse.    Shortness of Breath Shortness of breath means you have trouble breathing. It could also mean that you have a medical problem. You should get immediate medical care for shortness of breath. CAUSES   Not enough oxygen in the air such as with high altitudes or a smoke-filled room.  Certain lung diseases, infections, or problems.  Heart disease or conditions, such as angina or heart failure.  Low red blood cells (anemia).  Poor physical fitness, which can cause shortness of breath when you exercise.  Chest or back injuries or stiffness.  Being overweight.  Smoking.  Anxiety, which can make you feel like you are not getting enough air. DIAGNOSIS  Serious medical problems can often be found during your physical exam. Tests may also be done to determine why you are having shortness of breath. Tests may include:  Chest X-rays.  Lung function tests.  Blood tests.  An electrocardiogram (ECG).  An ambulatory electrocardiogram. An ambulatory ECG records your heartbeat patterns over a 24-hour period.  Exercise testing.  A transthoracic echocardiogram (TTE). During echocardiography, sound waves are used to evaluate how blood flows through your heart.  A transesophageal echocardiogram (TEE).  Imaging scans. Your health care provider may not be able to find a cause for your shortness of breath after your exam. In this  case, it is important to have a follow-up exam with your health care provider as directed.  TREATMENT  Treatment for shortness of breath depends on the cause of your symptoms and can vary greatly. HOME CARE INSTRUCTIONS   Do not smoke. Smoking is a common cause of shortness of breath. If you smoke, ask for help to quit.  Avoid being around chemicals or things that may bother your breathing, such as paint fumes and dust.  Rest as needed. Slowly resume your usual activities.  If medicines were prescribed, take them as directed for the full length of time directed. This includes oxygen and any inhaled medicines.  Keep all follow-up appointments as directed by your health care provider. SEEK MEDICAL CARE IF:   Your condition does not improve in the time expected.  You have a hard time doing your normal activities even with rest.  You have any new symptoms. SEEK IMMEDIATE MEDICAL CARE IF:   Your shortness of breath gets worse.  You feel light-headed, faint, or develop a cough not controlled with medicines.  You start coughing up blood.  You have pain with breathing.  You have chest pain or pain in your arms, shoulders, or abdomen.  You have a fever.  You are unable to walk up stairs or exercise the way you normally do. MAKE SURE YOU:  Understand these instructions.  Will watch your condition.  Will get help right away if you are not doing well or get worse.   This information is not intended to replace advice given to you by your health care provider.  Make sure you discuss any questions you have with your health care provider.   Document Released: 06/19/2001 Document Revised: 09/29/2013 Document Reviewed: 12/10/2011 Elsevier Interactive Patient Education 2016 [employer].    IF you received an x-ray today, you will receive an invoice from [facility]. Please contact [facility] at [phone] with questions or concerns regarding your invoice.   IF  you received labwork today, you will receive an invoice from [employer]. Please contact [facility] at [phone] with questions or concerns regarding your invoice.   Our billing staff will not be able to assist you with questions regarding bills from these companies.  You will be contacted with the lab results as soon as they are available. The fastest way to get your results is to activate your My Chart account. Instructions are located on the last page of this paperwork. If you have not heard from [place] regarding the results in 2 weeks, please contact this office.        IF you received an x-ray today, you will receive an invoice from [facility]. Please contact [facility] at [phone] with questions or concerns regarding your invoice.   IF you received labwork today, you will receive an invoice from [employer]. Please contact [facility] at [phone] with questions or concerns regarding your invoice.   Our billing staff will not be able to assist you with questions regarding bills from these companies.  You will be contacted with the lab results as soon as they are available. The fastest way to get your results is to activate your My Chart account. Instructions are located on the last page of this paperwork. If you have not heard from [place] regarding the results in 2 weeks, please contact this office.

## 2016-01-09 LAB — BRAIN NATRIURETIC PEPTIDE: Brain Natriuretic Peptide: 124.2 pg/mL — ABNORMAL HIGH (ref <100)

## 2016-01-13 ENCOUNTER — Telehealth: Payer: Self-pay

## 2016-01-23 ENCOUNTER — Encounter: Payer: Self-pay | Admitting: Gastroenterology

## 2016-01-25 ENCOUNTER — Ambulatory Visit (HOSPITAL_COMMUNITY): Payer: BC Managed Care – PPO | Attending: Osteopathic Medicine

## 2016-01-25 ENCOUNTER — Other Ambulatory Visit: Payer: Self-pay

## 2016-01-25 DIAGNOSIS — R0600 Dyspnea, unspecified: Secondary | ICD-10-CM

## 2016-01-25 DIAGNOSIS — R011 Cardiac murmur, unspecified: Secondary | ICD-10-CM

## 2016-01-25 DIAGNOSIS — R0609 Other forms of dyspnea: Secondary | ICD-10-CM | POA: Diagnosis not present

## 2016-01-26 ENCOUNTER — Other Ambulatory Visit (HOSPITAL_COMMUNITY): Payer: BC Managed Care – PPO

## 2016-03-20 ENCOUNTER — Ambulatory Visit: Payer: BC Managed Care – PPO | Admitting: Gastroenterology

## 2016-03-22 ENCOUNTER — Ambulatory Visit (INDEPENDENT_AMBULATORY_CARE_PROVIDER_SITE_OTHER): Payer: BC Managed Care – PPO | Admitting: Gastroenterology

## 2016-03-22 ENCOUNTER — Encounter: Payer: Self-pay | Admitting: Gastroenterology

## 2016-03-22 VITALS — BP 122/70 | HR 74 | Ht 64.0 in | Wt 157.0 lb

## 2016-03-22 DIAGNOSIS — K219 Gastro-esophageal reflux disease without esophagitis: Secondary | ICD-10-CM

## 2016-03-22 DIAGNOSIS — Z800 Family history of malignant neoplasm of digestive organs: Secondary | ICD-10-CM

## 2016-03-22 DIAGNOSIS — R1310 Dysphagia, unspecified: Secondary | ICD-10-CM | POA: Diagnosis not present

## 2016-03-22 MED ORDER — NA SULFATE-K SULFATE-MG SULF 17.5-3.13-1.6 GM/177ML PO SOLN: 1.0000 | Freq: Once | ORAL | Status: DC

## 2016-03-22 NOTE — Patient Instructions (Signed)
You will be set up for a colonoscopy for FH of colon cancer You will be set up for an upper endoscopy for GERD, dysphagia. Stay off antiacid medicines for now.

## 2016-03-22 NOTE — Progress Notes (Signed)
HPI: This is a   very pleasant 45 year old woman    who was referred to me by Wendie Agreste, MD  to evaluate  possible GERD, family history colon cancer .    Chief complaint is possible GERD, family history of colon cancer  Trouble with heaviness in her chest, mild dysphagia sensation.  This is for at least the past 3 years.  Can wax and wane.  The heaviness can occur intermittently.    The dysphagia occurs with about every meal.  Has seen cardiologist even in the past few months and she was told she does not have significant cardiac disease. They recommended antiacid medicines,   Has seen an allergist; was given antiacid medicines.  Has tried omeprazole.  She has tried it once a day, twice a day, for about 2-3 weeks.  Overall her weight is up 15 pounds.  Drinks tea , once a day.  Soda about one a day.  Her father had colon cancer, she thinks he was in late 50s, early 72s at time of diagnosis  She has no problems with her bowels.  Review of systems: Pertinent positive and negative review of systems were noted in the above HPI section. Complete review of systems was performed and was otherwise normal.   Past Medical History  Diagnosis Date  . Chest pain     December, 2013  . Dyspnea   . Abnormal EKG     December, 2013  . Anemia   . GERD (gastroesophageal reflux disease)     History reviewed. No pertinent past surgical history.  No current outpatient prescriptions on file.   No current facility-administered medications for this visit.    Allergies as of 03/22/2016  . (No Known Allergies)    Family History  Problem Relation Age of Onset  . Cirrhosis Mother   . Diabetes Mother   . Heart disease Maternal Grandmother   . Colon cancer Father   . Diabetes Father   . Hypertension Father     Social History   Social History  . Marital Status: Married    Spouse Name: N/A  . Number of Children: N/A  . Years of Education: N/A   Occupational History  . Not on  file.   Social History Main Topics  . Smoking status: Never Smoker   . Smokeless tobacco: Not on file  . Alcohol Use: No  . Drug Use: No  . Sexual Activity: Yes    Birth Control/ Protection: None   Other Topics Concern  . Not on file   Social History Narrative     Physical Exam: BP 122/70 mmHg  Pulse 74  Ht 5\' 4"  (1.626 m)  Wt 157 lb (71.215 kg)  BMI 26.94 kg/m2 Constitutional: generally well-appearing Psychiatric: alert and oriented x3 Eyes: extraocular movements intact Mouth: oral pharynx moist, no lesions Neck: supple no lymphadenopathy Cardiovascular: heart regular rate and rhythm Lungs: clear to auscultation bilaterally Abdomen: soft, nontender, nondistended, no obvious ascites, no peritoneal signs, normal bowel sounds Extremities: no lower extremity edema bilaterally Skin: no lesions on visible extremities   Assessment and plan: 45 y.o. female with  chest heaviness, family history of colon cancer, dysphasia she has been evaluated by cardiologist in the felt that her symptoms were most likely acid related. Her chest heaviness does improve when she is on proton pump inhibitors and so I tend to agree. The chest heaviness waxes and wanes currently she's not having any troubles with it. I recommended we proceed with  EGD to check for esophagitis, Barrett's change, stricturing that would account for her intermittent solid food nonprogressive dysphasia.  At the same time we will arrange for colonoscopy for family history of colon cancer in her father (late 53s early 47s).   Owens Loffler, MD [facility] 03/22/2016, 2:01 PM  Cc: Wendie Agreste, MD

## 2016-03-28 ENCOUNTER — Telehealth: Payer: Self-pay | Admitting: Physician Assistant

## 2016-03-28 ENCOUNTER — Ambulatory Visit (HOSPITAL_COMMUNITY)
Admission: RE | Admit: 2016-03-28 | Discharge: 2016-03-28 | Disposition: A | Discharge status: Home / Self Care | Payer: BC Managed Care – PPO | Source: Ambulatory Visit | Attending: Physician Assistant | Admitting: Physician Assistant

## 2016-03-28 ENCOUNTER — Ambulatory Visit (INDEPENDENT_AMBULATORY_CARE_PROVIDER_SITE_OTHER): Payer: BC Managed Care – PPO | Admitting: Physician Assistant

## 2016-03-28 VITALS — BP 118/72 | HR 76 | Temp 99.3°F | Resp 16 | Ht 64.0 in | Wt 154.0 lb

## 2016-03-28 DIAGNOSIS — D649 Anemia, unspecified: Secondary | ICD-10-CM

## 2016-03-28 DIAGNOSIS — D259 Leiomyoma of uterus, unspecified: Secondary | ICD-10-CM

## 2016-03-28 DIAGNOSIS — N852 Hypertrophy of uterus: Secondary | ICD-10-CM | POA: Insufficient documentation

## 2016-03-28 DIAGNOSIS — Z6826 Body mass index (BMI) 26.0-26.9, adult: Secondary | ICD-10-CM | POA: Insufficient documentation

## 2016-03-28 DIAGNOSIS — R102 Pelvic and perineal pain: Secondary | ICD-10-CM

## 2016-03-28 DIAGNOSIS — D72829 Elevated white blood cell count, unspecified: Secondary | ICD-10-CM | POA: Insufficient documentation

## 2016-03-28 DIAGNOSIS — R1030 Lower abdominal pain, unspecified: Secondary | ICD-10-CM | POA: Diagnosis not present

## 2016-03-28 DIAGNOSIS — M545 Low back pain, unspecified: Secondary | ICD-10-CM

## 2016-03-28 LAB — POCT URINALYSIS DIP (MANUAL ENTRY)
BILIRUBIN UA: NEGATIVE
GLUCOSE UA: NEGATIVE
Ketones, POC UA: NEGATIVE
NITRITE UA: NEGATIVE
Spec Grav, UA: <=1.005
Urobilinogen, UA: 0.2
pH, UA: 6

## 2016-03-28 LAB — POCT CBC
GRANULOCYTE PERCENT: 77.6 % (ref 37–80)
HEMATOCRIT: 28.7 % — AB (ref 37.7–47.9)
HEMOGLOBIN: 9.1 g/dL — AB (ref 12.2–16.2)
Lymph, poc: 2.1 (ref 0.6–3.4)
MCH: 22 pg — AB (ref 27–31.2)
MCHC: 31.6 g/dL — AB (ref 31.8–35.4)
MCV: 69.4 fL — AB (ref 80–97)
MID (cbc): 0.3 (ref 0–0.9)
MPV: 8.1 fL (ref 0–99.8)
POC GRANULOCYTE: 8.2 — AB (ref 2–6.9)
POC LYMPH PERCENT: 19.9 %L (ref 10–50)
POC MID %: 2.5 %M (ref 0–12)
Platelet Count, POC: 283 10*3/uL (ref 142–424)
RBC: 4.13 M/uL (ref 4.04–5.48)
RDW, POC: 17.8 %
WBC: 10.6 10*3/uL — AB (ref 4.6–10.2)

## 2016-03-28 LAB — POC MICROSCOPIC URINALYSIS (UMFC): MUCUS RE: ABSENT

## 2016-03-28 LAB — IRON,TIBC AND FERRITIN PANEL
%SAT: 5 % — ABNORMAL LOW (ref 11–50)
Ferritin: 6 ng/mL — ABNORMAL LOW (ref 10–232)
Iron: 28 ug/dL — ABNORMAL LOW (ref 40–190)
TIBC: 560 ug/dL — ABNORMAL HIGH (ref 250–450)

## 2016-03-28 LAB — POCT URINE PREGNANCY: PREG TEST UR: NEGATIVE

## 2016-03-28 MED ORDER — DIATRIZOATE MEGLUMINE & SODIUM 66-10 % PO SOLN
30.0000 mL | Freq: Once | ORAL | Status: AC
  Administered 2016-03-28: 30 mL via ORAL

## 2016-03-28 NOTE — Progress Notes (Signed)
Pt is scheduled for CT ABD/ Pelvis @ [facility] Pt needs to arrive at 12pm for drinking constrast Scheduled for 230pm Pt aware

## 2016-03-28 NOTE — Telephone Encounter (Signed)
Spoke with patient. Refer to GYn for additional evaluation of uterine fibroids.

## 2016-03-28 NOTE — Progress Notes (Signed)
Patient ID: Cathy Hernandez, female    DOB: [birth year], 45 y.o.   MRN: [id number]  PCP: Wendie Agreste, MD  Subjective:   Chief Complaint  Patient presents with  . Back Pain    NKI  . Abdominal Pain  . Nausea    HPI Presents for evaluation of back pain, abdominal pain and nausea..  Back pain began on 6/18, following working in the yard. She also developed low abdominal heaviness. The next morning she also had nausea.  No urinary burning, but no increase in urinary urgency or frequency. No hematuria. Last BM was this morning and was normal for her. LMP began this morning. Sex since her previous period used condoms. A couple of times in the past year she had similar pain associated with her menstrual cycle. Taking Advil and rest helped. Took acetaminophen 1000 mg x 3 doses for pain this episode has helped, but minimally.  No fever, chills. Nauseated, but no vomiting and able to eat and drink. Drinking liquids makes the abdominal heaviness worse. No vaginal discharge.  Has been referred to GI for evaluation of reflux. Dr. Ardis Hughs has scheduled upper and lower endoscopy.  Previously prescribed an iron supplement for anemia, but no longer taking it.    Review of Systems As above.    Patient Active Problem List   Diagnosis Date Noted  . BMI 26.0-26.9,adult 03/28/2016  . Dyspnea on exertion 01/08/2016  . Newly recognized heart murmur 01/08/2016  . Esophageal reflux 01/08/2016     Prior to Admission medications   Medication Sig Start Date End Date Taking? Authorizing Provider  Na Sulfate-K Sulfate-Mg Sulf 17.5-3.13-1.6 GM/180ML SOLN Take 1 kit by mouth once. 03/22/16  Yes Milus Banister, MD     No Known Allergies     Objective:  Physical Exam  Constitutional: She is oriented to person, place, and time. She appears well-developed and well-nourished. She is active and cooperative. No distress.  BP 118/72 mmHg  Pulse 76  Temp(Src) 99.3 F (37.4 C) (Oral)   Resp 16  Ht '5\' 4"'$  (1.626 m)  Wt 154 lb (69.854 kg)  BMI 26.42 kg/m2  SpO2 100%  LMP 03/28/2016  HENT:  Head: Normocephalic and atraumatic.  Right Ear: Hearing normal.  Left Ear: Hearing normal.  Eyes: Conjunctivae are normal. No scleral icterus.  Neck: Normal range of motion. Neck supple. No thyromegaly present.  Cardiovascular: Normal rate, regular rhythm and normal heart sounds.   Pulses:      Radial pulses are 2+ on the right side, and 2+ on the left side.  Pulmonary/Chest: Effort normal and breath sounds normal.  Abdominal: Soft. Normal appearance and bowel sounds are normal. There is no hepatosplenomegaly. There is tenderness in the right lower quadrant. There is no rigidity, no rebound, no guarding, no CVA tenderness and negative Murphy's sign.  Lymphadenopathy:       Head (right side): No tonsillar, no preauricular, no posterior auricular and no occipital adenopathy present.       Head (left side): No tonsillar, no preauricular, no posterior auricular and no occipital adenopathy present.    She has no cervical adenopathy.       Right: No supraclavicular adenopathy present.       Left: No supraclavicular adenopathy present.  Neurological: She is alert and oriented to person, place, and time. No sensory deficit.  Skin: Skin is warm, dry and intact. No rash noted. No cyanosis or erythema. Nails show no clubbing.  Psychiatric: She has a normal mood  and affect. Her speech is normal and behavior is normal.       Results for orders placed or performed in visit on 03/28/16  POCT Microscopic Urinalysis ([facility])  Result Value Ref Range   WBC,UR,HPF,POC Few (A) None WBC/hpf   RBC,UR,HPF,POC Too numerous to count  (A) None RBC/hpf   Bacteria Few (A) None, Too numerous to count   Mucus Absent Absent   Epithelial Cells, UR Per Microscopy Few (A) None, Too numerous to count cells/hpf  POCT urinalysis dipstick  Result Value Ref Range   Color, UA red (A) yellow   Clarity, UA cloudy (A)  clear   Glucose, UA negative negative   Bilirubin, UA negative negative   Ketones, POC UA negative negative   Spec Grav, UA <=1.005    Blood, UA large (A) negative   pH, UA 6.0    Protein Ur, POC trace (A) negative   Urobilinogen, UA 0.2    Nitrite, UA Negative Negative   Leukocytes, UA Trace (A) Negative  POCT CBC  Result Value Ref Range   WBC 10.6 (A) 4.6 - 10.2 K/uL   Lymph, poc 2.1 0.6 - 3.4   POC LYMPH PERCENT 19.9 10 - 50 %L   MID (cbc) 0.3 0 - 0.9   POC MID % 2.5 0 - 12 %M   POC Granulocyte 8.2 (A) 2 - 6.9   Granulocyte percent 77.6 37 - 80 %G   RBC 4.13 4.04 - 5.48 M/uL   Hemoglobin 9.1 (A) 12.2 - 16.2 g/dL   HCT, POC 28.7 (A) 37.7 - 47.9 %   MCV 69.4 (A) 80 - 97 fL   MCH, POC 22.0 (A) 27 - 31.2 pg   MCHC 31.6 (A) 31.8 - 35.4 g/dL   RDW, POC 17.8 %   Platelet Count, POC 283 142 - 424 K/uL   MPV 8.1 0 - 99.8 fL  POCT urine pregnancy  Result Value Ref Range   Preg Test, Ur Negative Negative       Assessment & Plan:   1. Lower abdominal pain 2. Bilateral low back pain without sciatica Uncertain etiology. Possible nephrolithiasis. Possible appendicitis. Await CT scan. - POCT Microscopic Urinalysis ([facility]) - POCT urinalysis dipstick - POCT CBC - CT Abdomen Pelvis Wo Contrast; Future - POCT urine pregnancy   3. Anemia, unspecified anemia type Await iron studies. Will likely recommend iron supplementation. May warrant colonoscopy, which can be arranged with Dr. Ardis Hughs. - Iron, TIBC and Ferritin Panel   Fara Chute, PA-C Physician Assistant-Certified [facility]

## 2016-03-28 NOTE — Patient Instructions (Addendum)
     IF you received an x-ray today, you will receive an invoice from [facility]. Please contact [facility] at [phone] with questions or concerns regarding your invoice.   IF you received labwork today, you will receive an invoice from [facility]. Please contact [facility] at [phone] with questions or concerns regarding your invoice.   Our billing staff will not be able to assist you with questions regarding bills from these companies.  You will be contacted with the lab results as soon as they are available. The fastest way to get your results is to activate your My Chart account. Instructions are located on the last page of this paperwork. If you have not heard from us regarding the results in 2 weeks, please contact this office.      

## 2016-04-11 ENCOUNTER — Encounter: Payer: Self-pay | Admitting: Obstetrics and Gynecology

## 2016-04-11 ENCOUNTER — Ambulatory Visit (INDEPENDENT_AMBULATORY_CARE_PROVIDER_SITE_OTHER): Payer: BC Managed Care – PPO | Admitting: Obstetrics and Gynecology

## 2016-04-11 VITALS — BP 102/60 | HR 76 | Resp 16 | Ht 63.75 in | Wt 152.0 lb

## 2016-04-11 DIAGNOSIS — Z124 Encounter for screening for malignant neoplasm of cervix: Secondary | ICD-10-CM

## 2016-04-11 DIAGNOSIS — D509 Iron deficiency anemia, unspecified: Secondary | ICD-10-CM

## 2016-04-11 DIAGNOSIS — N920 Excessive and frequent menstruation with regular cycle: Secondary | ICD-10-CM | POA: Diagnosis not present

## 2016-04-11 DIAGNOSIS — D259 Leiomyoma of uterus, unspecified: Secondary | ICD-10-CM

## 2016-04-11 MED ORDER — NORETHIN ACE-ETH ESTRAD-FE 1-20 MG-MCG PO TABS: 1.0000 | ORAL_TABLET | Freq: Every day | ORAL | Status: DC

## 2016-04-11 NOTE — Progress Notes (Signed)
Patient ID: Cathy Hernandez, female   DOB: [birth year], 45 y.o.   MRN: [id number] 45 y.o. [id number] SingleAfrican AmericanF here for a consultation by Porfirio Oar, PA-C, for anemia and a newly diagnosed fibroid uterus. Recent hgb of 9.1. CT with multiple fibroids, largest 5.3 cm.   Period Cycle (Days): 28 Period Duration (Days): 3-5 days  Period Pattern: Regular Menstrual Flow: Heavy, Light Menstrual Control: Tampon, Maxi pad, Thin pad Menstrual Control Change Freq (Hours): changes super pad and super tampon every 3 hours on heavy days  Dysmenorrhea: (!) Moderate Dysmenorrhea Symptoms: Cramping, Other (Comment)  She can leak through a super plus tampon in about 3 hours, the pad isn't full at that time. Cycles have been this heavy for at least a year. Heavy for 2 days of her cycle. Normally she doesn't have cramps, just with her last cycle.  She does report more frequent urination, some urgency, no leakage. No baseline abdominal pain.  Sexually active, same partner x 5 months. Using condoms. No dyspareunia.   Patient's last menstrual period was 03/28/2016.          Sexually active:  YES  The current method of family planning is condoms every time .    Exercising: No.  The patient does not participate in regular exercise at present. Smoker:  no  Health Maintenance: Pap:  04/2014 WNL per patient  History of abnormal Pap:  Yes early 20s, s/p cryosurgery, normal since then.  MMG:  Never Colonoscopy:  Scheduled 05-09-16 BMD:   Never TDaP:  06-10-15 Gardasil: no    reports that she has never smoked. She has never used smokeless tobacco. She reports that she does not drink alcohol or use illicit drugs. She is a middle school Editor, commissioning. She has 2 daughter's, both finished with college  Past Medical History  Diagnosis Date  . Chest pain     December, 2013  . Dyspnea   . Abnormal EKG     December, 2013  . Anemia   . GERD (gastroesophageal reflux disease)   She has seen a cardiologist,  negative evaluation. The discomfort was felt to be from Reflux.   Past Surgical History  Procedure Laterality Date  . No past surgeries      Current Outpatient Prescriptions  Medication Sig Dispense Refill  . Na Sulfate-K Sulfate-Mg Sulf 17.5-3.13-1.6 GM/180ML SOLN Take 1 kit by mouth once. (Patient not taking: Reported on 04/11/2016) 354 mL 0   No current facility-administered medications for this visit.    Family History  Problem Relation Age of Onset  . Cirrhosis Mother   . Diabetes Mother   . Stroke Mother 73  . Osteoporosis Mother   . Heart disease Maternal Grandmother   . Colon cancer Father   . Diabetes Father   . Hypertension Father   Father was 39 with diagnosis, died at 5  Review of Systems  Constitutional: Negative.   HENT: Negative.   Eyes: Negative.   Respiratory: Negative.   Cardiovascular: Negative.   Gastrointestinal: Negative.   Endocrine: Negative.   Genitourinary: Positive for menstrual problem.       Heavy menstrual cycles Very painful last cycle  Musculoskeletal: Negative.   Skin: Negative.   Allergic/Immunologic: Negative.   Neurological: Negative.   Psychiatric/Behavioral: Negative.     Exam:   BP 102/60 mmHg  Pulse 76  Resp 16  Ht 5' 3.75" (1.619 m)  Wt 152 lb (68.947 kg)  BMI 26.30 kg/m2  LMP 03/28/2016  Weight change: @WEIGHTCHANGE @ Height:  Height: 5' 3.75" (161.9 cm)  Ht Readings from Last 3 Encounters:  04/11/16 5' 3.75" (1.619 m)  03/28/16 '5\' 4"'$  (1.626 m)  03/22/16 '5\' 4"'$  (1.626 m)    General appearance: alert, cooperative and appears stated age Head: Normocephalic, without obvious abnormality, atraumatic Neck: no adenopathy, supple, symmetrical, trachea midline and thyroid normal to inspection and palpation Lungs: clear to auscultation bilaterally Heart: regular rate and rhythm Breasts: no lumps, dimpling or skin changes Abdomen: soft, non-tender; bowel sounds normal; no masses,  no organomegaly Extremities: extremities  normal, atraumatic, no cyanosis or edema Skin: Skin color, texture, turgor normal. No rashes or lesions Lymph nodes: Cervical, supraclavicular, and axillary nodes normal. No abnormal inguinal nodes palpated Neurologic: Grossly normal   Pelvic: External genitalia:  no lesions              Urethra:  normal appearing urethra with no masses, tenderness or lesions              Bartholins and Skenes: normal                 Vagina: normal appearing vagina with normal color and discharge, no lesions              Cervix: no lesions               Bimanual Exam:  Uterus:  enlarged, 10 weeks size, irregular and anteverted              Adnexa: no mass, fullness, tenderness               Rectovaginal: Confirms               Anus:  normal sphincter tone, no lesions  Chaperone was present for exam.  A:  Fibroid uterus  Menorrhagia  Anemia  P:   Start OCP's, no contraindications, risks reviewed  F/U for an ultrasound  Start iron (can get iron with magnesium to help prevent constipaiton)  Pap with hpv  Mammogram recommended (she has never had one)  CC: Harrison Mons, PA-C Letter sent

## 2016-04-11 NOTE — Patient Instructions (Signed)
Oral Contraception Information Oral contraceptive pills (OCPs) are medicines taken to prevent pregnancy. OCPs work by preventing the ovaries from releasing eggs. The hormones in OCPs also cause the cervical mucus to thicken, preventing the sperm from entering the uterus. The hormones also cause the uterine lining to become thin, not allowing a fertilized egg to attach to the inside of the uterus. OCPs are highly effective when taken exactly as prescribed. However, OCPs do not prevent sexually transmitted diseases (STDs). Safe sex practices, such as using condoms along with the pill, can help prevent STDs.  Before taking the pill, you may have a physical exam and Pap test. Your health care provider may order blood tests. The health care provider will make sure you are a good candidate for oral contraception. Discuss with your health care provider the possible side effects of the OCP you may be prescribed. When starting an OCP, it can take 2 to 3 months for the body to adjust to the changes in hormone levels in your body.  TYPES OF ORAL CONTRACEPTION  The combination pill--This pill contains estrogen and progestin (synthetic progesterone) hormones. The combination pill comes in 21-day, 28-day, or 91-day packs. Some types of combination pills are meant to be taken continuously (365-day pills). With 21-day packs, you do not take pills for 7 days after the last pill. With 28-day packs, the pill is taken every day. The last 7 pills are without hormones. Certain types of pills have more than 21 hormone-containing pills. With 91-day packs, the first 84 pills contain both hormones, and the last 7 pills contain no hormones or contain estrogen only.  The minipill--This pill contains the progesterone hormone only. The pill is taken every day continuously. It is very important to take the pill at the same time each day. The minipill comes in packs of 28 pills. All 28 pills contain the hormone.  ADVANTAGES OF ORAL  CONTRACEPTIVE PILLS  Decreases premenstrual symptoms.   Treats menstrual period cramps.   Regulates the menstrual cycle.   Decreases a heavy menstrual flow.   May treatacne, depending on the type of pill.   Treats abnormal uterine bleeding.   Treats polycystic ovarian syndrome.   Treats endometriosis.   Can be used as emergency contraception.  THINGS THAT CAN MAKE ORAL CONTRACEPTIVE PILLS LESS EFFECTIVE OCPs can be less effective if:   You forget to take the pill at the same time every day.   You have a stomach or intestinal disease that lessens the absorption of the pill.   You take OCPs with other medicines that make OCPs less effective, such as antibiotics, certain HIV medicines, and some seizure medicines.   You take expired OCPs.   You forget to restart the pill on day 7, when using the packs of 21 pills.  RISKS ASSOCIATED WITH ORAL CONTRACEPTIVE PILLS  Oral contraceptive pills can sometimes cause side effects, such as:  Headache.  Nausea.  Breast tenderness.  Irregular bleeding or spotting. Combination pills are also associated with a small increased risk of:  Blood clots.  Heart attack.  Stroke.   This information is not intended to replace advice given to you by your health care provider. Make sure you discuss any questions you have with your health care provider.   Document Released: 12/15/2002 Document Revised: 07/15/2013 Document Reviewed: 03/15/2013 Elsevier Interactive Patient Education 2016 Else[employer] Inc[employer] Uterine Fibroids Uterine fibroids are tissue masses (tumors) that can develop in the womb (uterus). They are also called leiomyomas. This type of  tumor is not cancerous (benign) and does not spread to other parts of the body outside of the pelvic area, which is between the hip bones. Occasionally, fibroids may develop in the fallopian tubes, in the cervix, or on the support structures (ligaments) that surround the uterus. You can  have one or many fibroids. Fibroids can vary in size, weight, and where they grow in the uterus. Some can become quite large. Most fibroids do not require medical treatment. CAUSES A fibroid can develop when a single uterine cell keeps growing (replicating). Most cells in the human body have a control mechanism that keeps them from replicating without control. SIGNS AND SYMPTOMS Symptoms may include:   Heavy bleeding during your period.  Bleeding or spotting between periods.  Pelvic pain and pressure.  Bladder problems, such as needing to urinate more often (urinary frequency) or urgently.  Inability to reproduce offspring (infertility).  Miscarriages. DIAGNOSIS Uterine fibroids are diagnosed through a physical exam. Your health care provider may feel the lumpy tumors during a pelvic exam. Ultrasonography and an MRI may be done to determine the size, location, and number of fibroids. TREATMENT Treatment may include:  Watchful waiting. This involves getting the fibroid checked by your health care provider to see if it grows or shrinks. Follow your health care provider's recommendations for how often to have this checked.  Hormone medicines. These can be taken by mouth or given through an intrauterine device (IUD).  Surgery.  Removing the fibroids (myomectomy) or the uterus (hysterectomy).  Removing blood supply to the fibroids (uterine artery embolization). If fibroids interfere with your fertility and you want to become pregnant, your health care provider may recommend having the fibroids removed.  HOME CARE INSTRUCTIONS  Keep all follow-up visits as directed by your health care provider. This is important.  Take medicines only as directed by your health care provider.  If you were prescribed a hormone treatment, take the hormone medicines exactly as directed.  Do not take aspirin, because it can cause bleeding.  Ask your health care provider about taking iron pills and  increasing the amount of dark green, leafy vegetables in your diet. These actions can help to boost your blood iron levels, which may be affected by heavy menstrual bleeding.  Pay close attention to your period and tell your health care provider about any changes, such as:  Increased blood flow that requires you to use more pads or tampons than usual per month.  A change in the number of days that your period lasts per month.  A change in symptoms that are associated with your period, such as abdominal cramping or back pain. SEEK MEDICAL CARE IF:  You have pelvic pain, back pain, or abdominal cramps that cannot be controlled with medicines.  You have an increase in bleeding between and during periods.  You soak tampons or pads in a half hour or less.  You feel lightheaded, extra tired, or weak. SEEK IMMEDIATE MEDICAL CARE IF:  You faint.  You have a sudden increase in pelvic pain.   This information is not intended to replace advice given to you by your health care provider. Make sure you discuss any questions you have with your health care provider.   Document Released: 09/21/2000 Document Revised: 10/15/2014 Document Reviewed: 03/23/2014 Elsevier Interactive Patient Education [employer].

## 2016-04-12 NOTE — Addendum Note (Signed)
Addended by: Dorothy Spark on: 04/12/2016 01:54 PM   Modules accepted: Orders

## 2016-04-16 LAB — IPS PAP TEST WITH HPV

## 2016-04-17 ENCOUNTER — Telehealth: Payer: Self-pay | Admitting: Gastroenterology

## 2016-04-17 ENCOUNTER — Other Ambulatory Visit: Payer: Self-pay | Admitting: Obstetrics and Gynecology

## 2016-04-17 DIAGNOSIS — Z1231 Encounter for screening mammogram for malignant neoplasm of breast: Secondary | ICD-10-CM

## 2016-04-17 NOTE — Telephone Encounter (Signed)
Patient advised that she should hold her iron supplements one week prior to her procedure, but other vitamins are ok to continue.  She will call back for any additional questions or concerns.

## 2016-04-19 ENCOUNTER — Encounter: Payer: BC Managed Care – PPO | Admitting: Obstetrics and Gynecology

## 2016-04-24 ENCOUNTER — Encounter: Payer: Self-pay | Admitting: Obstetrics and Gynecology

## 2016-04-24 ENCOUNTER — Ambulatory Visit (INDEPENDENT_AMBULATORY_CARE_PROVIDER_SITE_OTHER): Payer: BC Managed Care – PPO

## 2016-04-24 ENCOUNTER — Ambulatory Visit (INDEPENDENT_AMBULATORY_CARE_PROVIDER_SITE_OTHER): Payer: BC Managed Care – PPO | Admitting: Obstetrics and Gynecology

## 2016-04-24 ENCOUNTER — Encounter: Payer: Self-pay | Admitting: Gastroenterology

## 2016-04-24 VITALS — BP 102/60 | HR 64 | Resp 14 | Wt 154.0 lb

## 2016-04-24 DIAGNOSIS — D509 Iron deficiency anemia, unspecified: Secondary | ICD-10-CM | POA: Diagnosis not present

## 2016-04-24 DIAGNOSIS — N920 Excessive and frequent menstruation with regular cycle: Secondary | ICD-10-CM

## 2016-04-24 DIAGNOSIS — D259 Leiomyoma of uterus, unspecified: Secondary | ICD-10-CM

## 2016-04-24 NOTE — Progress Notes (Signed)
GYNECOLOGY  VISIT   HPI: 45 y.o.   Single  African American  female   [phone] with Patient's last menstrual period was 03/28/2016.   here for c/o menorrhagia and anemia. Fibroid uterus. She just started her cycle today and is starting OCP's today.   GYNECOLOGIC HISTORY: Patient's last menstrual period was 03/28/2016. Contraception:OCP Menopausal hormone therapy: NONE        OB History    Gravida Para Term Preterm AB TAB SAB Ectopic Multiple Living   _0 Patient Active Problem List   Diagnosis Date Noted  . BMI 26.0-26.9,adult 03/28/2016  . Dyspnea on exertion 01/08/2016  . Newly recognized heart murmur 01/08/2016  . Esophageal reflux 01/08/2016    Past Medical History  Diagnosis Date  . Chest pain     December, 2013  . Dyspnea   . Abnormal EKG     December, 2013  . Anemia   . GERD (gastroesophageal reflux disease)     Past Surgical History  Procedure Laterality Date  . No past surgeries      Current Outpatient Prescriptions  Medication Sig Dispense Refill  . docusate sodium (COLACE) 100 MG capsule Take 100 mg by mouth 2 (two) times daily.    . Iron-Vit C-Vit B12-Folic Acid (IRON 855 PLUS) 100-250-0.025-1 MG TABS Take by mouth.    . Magnesium 250 MG TABS Take 250 mg by mouth daily.    . norethindrone-ethinyl estradiol (JUNEL FE,GILDESS FE,LOESTRIN FE) 1-20 MG-MCG tablet Take 1 tablet by mouth daily. 3 Package 0  . Na Sulfate-K Sulfate-Mg Sulf 17.5-3.13-1.6 GM/180ML SOLN Take 1 kit by mouth once. (Patient not taking: Reported on 04/11/2016) 354 mL 0   No current facility-administered medications for this visit.     ALLERGIES: Review of patient's allergies indicates no known allergies.  Family History  Problem Relation Age of Onset  . Cirrhosis Mother   . Diabetes Mother   . Stroke Mother 85  . Osteoporosis Mother   . Heart disease Maternal Grandmother   . Colon cancer Father   . Diabetes Father   . Hypertension Father      Social History   Social History  . Marital Status: Single    Spouse Name: n/a  . Number of Children: 0  . Years of Education: Master's   Occupational History  . teacher educator     [place] History Main Topics  . Smoking status: Never Smoker   . Smokeless tobacco: Never Used  . Alcohol Use: No  . Drug Use: No  . Sexual Activity:    Partners: Male    Birth Control/ Protection:    Other Topics Concern  . Not on file   Social History Narrative   Lives with her oldest daughter and granddaughter.    Review of Systems  Constitutional: Negative.   HENT: Negative.   Eyes: Negative.   Respiratory: Negative.   Cardiovascular: Negative.   Gastrointestinal: Negative.   Genitourinary: Negative.   Musculoskeletal: Negative.   Skin: Negative.   Neurological: Negative.   Endo/Heme/Allergies: Negative.   Psychiatric/Behavioral: Negative.     PHYSICAL EXAMINATION:    BP 102/60 mmHg  Pulse 64  Resp 14  Wt 154 lb (69.854 kg)  LMP 03/28/2016    General appearance: alert, cooperative and appears stated age  ASSESSMENT Fibroid uterus with menorrhagia and anemia,     PLAN  Starting OCP's F/u in 3 months Will check her CBC, ferritin at her f/u visit She will continue on BID iron If the OCP's don't work, we discussed the options of depo-provera, a trial of the mirena IUD, uterine artery embolization and TLH   An After Visit Summary was printed and given to the patient.  10 minutes face to face time of which over 50% was spent in counseling.

## 2016-05-01 ENCOUNTER — Ambulatory Visit: Payer: BC Managed Care – PPO

## 2016-05-02 ENCOUNTER — Telehealth: Payer: Self-pay | Admitting: Gastroenterology

## 2016-05-02 NOTE — Telephone Encounter (Signed)
Pt notified she can continue her BC pills up until her procedure, no need to alter.

## 2016-05-04 ENCOUNTER — Ambulatory Visit
Admission: RE | Admit: 2016-05-04 | Discharge: 2016-05-04 | Disposition: A | Discharge status: Home / Self Care | Payer: BC Managed Care – PPO | Source: Ambulatory Visit | Attending: Obstetrics and Gynecology | Admitting: Obstetrics and Gynecology

## 2016-05-04 DIAGNOSIS — Z1231 Encounter for screening mammogram for malignant neoplasm of breast: Secondary | ICD-10-CM

## 2016-05-08 ENCOUNTER — Other Ambulatory Visit: Payer: Self-pay | Admitting: Obstetrics and Gynecology

## 2016-05-08 DIAGNOSIS — R928 Other abnormal and inconclusive findings on diagnostic imaging of breast: Secondary | ICD-10-CM

## 2016-05-09 ENCOUNTER — Encounter: Payer: Self-pay | Admitting: Gastroenterology

## 2016-05-09 ENCOUNTER — Ambulatory Visit (AMBULATORY_SURGERY_CENTER): Payer: BC Managed Care – PPO | Admitting: Gastroenterology

## 2016-05-09 VITALS — BP 122/73 | HR 79 | Temp 98.9°F | Resp 16 | Ht 64.0 in | Wt 157.0 lb

## 2016-05-09 DIAGNOSIS — R1310 Dysphagia, unspecified: Secondary | ICD-10-CM

## 2016-05-09 DIAGNOSIS — Z800 Family history of malignant neoplasm of digestive organs: Secondary | ICD-10-CM | POA: Diagnosis present

## 2016-05-09 DIAGNOSIS — K219 Gastro-esophageal reflux disease without esophagitis: Secondary | ICD-10-CM | POA: Diagnosis not present

## 2016-05-09 DIAGNOSIS — Z1211 Encounter for screening for malignant neoplasm of colon: Secondary | ICD-10-CM

## 2016-05-09 DIAGNOSIS — Z86010 Personal history of colonic polyps: Secondary | ICD-10-CM

## 2016-05-09 MED ORDER — SODIUM CHLORIDE 0.9 % IV SOLN: 500.0000 mL | INTRAVENOUS | Status: DC

## 2016-05-09 NOTE — Op Note (Signed)
[facility] Patient Name: Cathy Hernandez Procedure Date: 05/09/2016 1:17 PM MRN: [id number] Endoscopist: Milus Banister , MD Age: 45 Referring MD:  Date of Birth: 06/17/1971 Gender: Female Account #: [id number] Procedure:                Colonoscopy Indications:              Screening in patient at increased risk: Family                            history of 1st-degree relative with colorectal                            cancer before age 58 years (father) Medicines:                Monitored Anesthesia Care Procedure:                Pre-Anesthesia Assessment:                           - Prior to the procedure, a History and Physical                            was performed, and patient medications and                            allergies were reviewed. The patient's tolerance of                            previous anesthesia was also reviewed. The risks                            and benefits of the procedure and the sedation                            options and risks were discussed with the patient.                            All questions were answered, and informed consent                            was obtained. Prior Anticoagulants: The patient has                            taken no previous anticoagulant or antiplatelet                            agents. ASA Grade Assessment: II - A patient with                            mild systemic disease. After reviewing the risks                            and benefits, the patient was deemed in  satisfactory condition to undergo the procedure.                           After obtaining informed consent, the colonoscope                            was passed under direct vision. Throughout the                            procedure, the patient's blood pressure, pulse, and                            oxygen saturations were monitored continuously. The                            Model [id number]-[id number])  scope was introduced                            through the anus and advanced to the the cecum,                            identified by appendiceal orifice and ileocecal                            valve. The colonoscopy was performed without                            difficulty. The patient tolerated the procedure                            well. The quality of the bowel preparation was                            excellent. The ileocecal valve, appendiceal                            orifice, and rectum were photographed. Scope In: 1:33:06 PM Scope Out: 1:40:47 PM Scope Withdrawal Time: 0 hours 6 minutes 5 seconds  Total Procedure Duration: 0 hours 7 minutes 41 seconds  Findings:                 A few small-mouthed diverticula were found in the                            left colon.                           The exam was otherwise without abnormality on                            direct and retroflexion views. Complications:            No immediate complications. Estimated blood loss:                            None. Estimated  Blood Loss:     Estimated blood loss: none. Impression:               - Diverticulosis in the left colon.                           - The examination was otherwise normal on direct                            and retroflexion views.                           - No polyps or cancers. Recommendation:           - Patient has a contact number available for                            emergencies. The signs and symptoms of potential                            delayed complications were discussed with the                            patient. Return to normal activities tomorrow.                            Written discharge instructions were provided to the                            patient.                           - Resume previous diet.                           - Continue present medications.                           - Repeat colonoscopy in 5 years for screening                             purposes. There is no need for colon cancer                            screening by any method (including stool testing)                            prior to then. Milus Banister, MD 05/09/2016 1:43:37 PM This report has been signed electronically.

## 2016-05-09 NOTE — Patient Instructions (Signed)
Diverticulosis, esophagitis seen. Handouts given.  Try using Omeprazole over-the counter 20 mg 1 pill 20-30 minutes before breakfast meal daily. Call Dr.Jacobs to report your swallowing after 4-5 weeks on the omeprazole. Chew your food well, eat slowly and take small bites.  Continue current medications. Repeat colonoscopy in 5 years. Call us with any questions or concerns. Thank you!   YOU HAD AN ENDOSCOPIC PROCEDURE TODAY AT [facility]:   Refer to the procedure report that was given to you for any specific questions about what was found during the examination.  If the procedure report does not answer your questions, please call your gastroenterologist to clarify.  If you requested that your care partner not be given the details of your procedure findings, then the procedure report has been included in a sealed envelope for you to review at your convenience later.  YOU SHOULD EXPECT: Some feelings of bloating in the abdomen. Passage of more gas than usual.  Walking can help get rid of the air that was put into your GI tract during the procedure and reduce the bloating. If you had a lower endoscopy (such as a colonoscopy or flexible sigmoidoscopy) you may notice spotting of blood in your stool or on the toilet paper. If you underwent a bowel prep for your procedure, you may not have a normal bowel movement for a few days.  Please Note:  You might notice some irritation and congestion in your nose or some drainage.  This is from the oxygen used during your procedure.  There is no need for concern and it should clear up in a day or so.  SYMPTOMS TO REPORT IMMEDIATELY:   Following lower endoscopy (colonoscopy or flexible sigmoidoscopy):  Excessive amounts of blood in the stool  Significant tenderness or worsening of abdominal pains  Swelling of the abdomen that is new, acute  Fever of 100F or higher   Following upper endoscopy (EGD)  Vomiting of blood or coffee ground  material  New chest pain or pain under the shoulder blades  Painful or persistently difficult swallowing  New shortness of breath  Fever of 100F or higher  Black, tarry-looking stools  For urgent or emergent issues, a gastroenterologist can be reached at any hour by calling [phone].   DIET: Your first meal following the procedure should be a small meal and then it is ok to progress to your normal diet. Heavy or fried foods are harder to digest and may make you feel nauseous or bloated.  Likewise, meals heavy in dairy and vegetables can increase bloating.  Drink plenty of fluids but you should avoid alcoholic beverages for 24 hours.  ACTIVITY:  You should plan to take it easy for the rest of today and you should NOT DRIVE or use heavy machinery until tomorrow (because of the sedation medicines used during the test).    FOLLOW UP: Our staff will call the number listed on your records the next business day following your procedure to check on you and address any questions or concerns that you may have regarding the information given to you following your procedure. If we do not reach you, we will leave a message.  However, if you are feeling well and you are not experiencing any problems, there is no need to return our call.  We will assume that you have returned to your regular daily activities without incident.  If any biopsies were taken you will be contacted by phone or by letter within  the next 1-3 weeks.  Please call us at [phone] if you have not heard about the biopsies in 3 weeks.    SIGNATURES/CONFIDENTIALITY: You and/or your care partner have signed paperwork which will be entered into your electronic medical record.  These signatures attest to the fact that that the information above on your After Visit Summary has been reviewed and is understood.  Full responsibility of the confidentiality of this discharge information lies with you and/or your care-partner.

## 2016-05-09 NOTE — Progress Notes (Signed)
Report to PACU, RN, vss, BBS= Clear.  

## 2016-05-09 NOTE — Op Note (Signed)
[facility] Patient Name: Cathy Hernandez Procedure Date: 05/09/2016 1:17 PM MRN: [id number] Endoscopist: Milus Banister , MD Age: 45 Referring MD:  Date of Birth: [birth year] Gender: Female Account #: [id number] Procedure:                Upper GI endoscopy Indications:              Dysphagia Medicines:                Monitored Anesthesia Care Procedure:                Pre-Anesthesia Assessment:                           - Prior to the procedure, a History and Physical                            was performed, and patient medications and                            allergies were reviewed. The patient's tolerance of                            previous anesthesia was also reviewed. The risks                            and benefits of the procedure and the sedation                            options and risks were discussed with the patient.                            All questions were answered, and informed consent                            was obtained. Prior Anticoagulants: The patient has                            taken no previous anticoagulant or antiplatelet                            agents. ASA Grade Assessment: II - A patient with                            mild systemic disease. After reviewing the risks                            and benefits, the patient was deemed in                            satisfactory condition to undergo the procedure.                           After obtaining informed consent, the endoscope was  passed under direct vision. Throughout the                            procedure, the patient's blood pressure, pulse, and                            oxygen saturations were monitored continuously. The                            Model [id number]-[id number] scope was introduced                            through the mouth, and advanced to the second part                            of duodenum. The upper GI endoscopy was                           accomplished without difficulty. The patient                            tolerated the procedure well. Scope In: Scope Out: Findings:                 There was mild reflux related edema, slight                            inflammation at the GE junction without stricture.                           The exam was otherwise without abnormality. Complications:            No immediate complications. Estimated blood loss:                            None. Estimated Blood Loss:     Estimated blood loss: none. Impression:               - Mild, reflux related esophagitis                           - The examination was otherwise normal.                           - No specimens collected. Recommendation:           - Patient has a contact number available for                            emergencies. The signs and symptoms of potential                            delayed complications were discussed with the                            patient. Return to normal activities tomorrow.  Written discharge instructions were provided to the                            patient.                           - Resume previous diet.                           - Continue present medications. Please start once                            daily omeprazole (20mg  pill, one pill 20-30 min                            prior to breakfast meal).                           - Call to report on your swallowing after 4-5 weeks                            on the omeprazole. Chew your food well, eat slowly                            and take small bites. Milus Banister, MD 05/09/2016 1:50:22 PM This report has been signed electronically.

## 2016-05-10 ENCOUNTER — Telehealth: Payer: Self-pay

## 2016-05-10 NOTE — Telephone Encounter (Signed)
  Follow up Call-  Call back number 05/09/2016  Post procedure Call Back phone  # [phone]  Permission to leave phone message Yes  Some recent data might be hidden     Patient questions:  Do you have a fever, pain , or abdominal swelling? No. Pain Score  0 *  Have you tolerated food without any problems? Yes.    Have you been able to return to your normal activities? Yes.    Do you have any questions about your discharge instructions: Diet   No. Medications  No. Follow up visit  No.  Do you have questions or concerns about your Care? No.  Actions: * If pain score is 4 or above: No action needed, pain <4.

## 2016-05-16 ENCOUNTER — Ambulatory Visit
Admission: RE | Admit: 2016-05-16 | Discharge: 2016-05-16 | Disposition: A | Discharge status: Home / Self Care | Payer: BC Managed Care – PPO | Source: Ambulatory Visit | Attending: Obstetrics and Gynecology | Admitting: Obstetrics and Gynecology

## 2016-05-16 DIAGNOSIS — R928 Other abnormal and inconclusive findings on diagnostic imaging of breast: Secondary | ICD-10-CM

## 2016-05-31 ENCOUNTER — Telehealth: Payer: Self-pay | Admitting: *Deleted

## 2016-05-31 NOTE — Telephone Encounter (Signed)
-----   Message from Salvadore Dom, MD sent at 05/24/2016  4:15 PM EDT ----- Please let the patient know that her imaging is c/w a benign process, but she did just start on OCP's. OCP's could potentially increase the risk of breast cancer or growth of a breast cancer. I don't think she needs to go off of OCP's, I just want her to be aware and it's important that she keep her f/u imaging appointment.

## 2016-05-31 NOTE — Telephone Encounter (Signed)
Message left to return call to Triage Nurse at [phone].    

## 2016-06-06 NOTE — Telephone Encounter (Signed)
Left message for call back.

## 2016-06-13 NOTE — Telephone Encounter (Signed)
Please send a letter and close the encounter.  

## 2016-06-13 NOTE — Telephone Encounter (Signed)
Dr. Talbert Nan,  Please advise. This patient has been attempted to reach x2 with no return call.   Routing to provider for review.

## 2016-06-14 ENCOUNTER — Encounter: Payer: Self-pay | Admitting: *Deleted

## 2016-06-14 NOTE — Telephone Encounter (Signed)
Letter to Dr. Talbert Nan to sign.

## 2016-06-27 NOTE — Telephone Encounter (Signed)
Message noted from Dr. Jertson. Encounter closed.  

## 2016-07-08 ENCOUNTER — Other Ambulatory Visit: Payer: Self-pay | Admitting: Obstetrics and Gynecology

## 2016-07-09 NOTE — Telephone Encounter (Signed)
One pack on OCP's sent to her pharmacy

## 2016-07-09 NOTE — Telephone Encounter (Signed)
Medication refill request: Junel Last AEX:  04/11/16 (New GYN appt) JJ Next AEX: 07/12/16 JJ Last MMG (if hormonal medication request): 05/07/16 BIRADS0, 05/16/16 DX & U/S L Breast-BIARDS 3-Probably Benign Refill authorized: 04/11/16 #3 Packages 0R. Please advise. Thank you.

## 2016-07-12 ENCOUNTER — Encounter: Payer: Self-pay | Admitting: Obstetrics and Gynecology

## 2016-07-12 ENCOUNTER — Ambulatory Visit (INDEPENDENT_AMBULATORY_CARE_PROVIDER_SITE_OTHER): Payer: BC Managed Care – PPO | Admitting: Obstetrics and Gynecology

## 2016-07-12 VITALS — BP 124/68 | HR 80 | Resp 13 | Wt 154.0 lb

## 2016-07-12 DIAGNOSIS — N920 Excessive and frequent menstruation with regular cycle: Secondary | ICD-10-CM

## 2016-07-12 DIAGNOSIS — Z862 Personal history of diseases of the blood and blood-forming organs and certain disorders involving the immune mechanism: Secondary | ICD-10-CM

## 2016-07-12 DIAGNOSIS — D259 Leiomyoma of uterus, unspecified: Secondary | ICD-10-CM

## 2016-07-12 LAB — CBC
HEMATOCRIT: 39.7 % (ref 35.0–45.0)
HEMOGLOBIN: 12.9 g/dL (ref 11.7–15.5)
MCH: 28.2 pg (ref 27.0–33.0)
MCHC: 32.5 g/dL (ref 32.0–36.0)
MCV: 86.9 fL (ref 80.0–100.0)
MPV: 11.1 fL (ref 7.5–12.5)
Platelets: 258 10*3/uL (ref 140–400)
RBC: 4.57 MIL/uL (ref 3.80–5.10)
RDW: 18.2 % — ABNORMAL HIGH (ref 11.0–15.0)
WBC: 5.1 10*3/uL (ref 3.8–10.8)

## 2016-07-12 LAB — FERRITIN: FERRITIN: 22 ng/mL (ref 10–232)

## 2016-07-12 NOTE — Progress Notes (Signed)
GYNECOLOGY  VISIT   HPI: 45 y.o.   Single  African American  female   [phone] with Patient's last menstrual period was 07/10/2016.   here for follow up OCP and anemia. The patient was started on OCP's for menorrhagia and anemia with her fibroid uterus. She is bleeding monthly on OCP's for 5 days, initially spotting most of the first month. Now down to 5 days in the last month. Doesn't seem as heavy. Previously was changing a pad and tampon in 3 hours for 2 days of her cycle. Now every 4 hours, but not as full, the heavier bleeding lasts x 2-3 days. No cramps.  No side effects or problems.      She is taking her iron tablets 1 x a day (was taking them 2 x a day)  GYNECOLOGIC HISTORY: Patient's last menstrual period was 07/10/2016. Contraception:OCP Menopausal hormone therapy: none         OB History    Gravida Para Term Preterm AB Living   4 2 2   2 2    SAB TAB Ectopic Multiple Live Births           2         Patient Active Problem List   Diagnosis Date Noted  . Fibroid uterus   . BMI 26.0-26.9,adult 03/28/2016  . Dyspnea on exertion 01/08/2016  . Newly recognized heart murmur 01/08/2016  . Esophageal reflux 01/08/2016    Past Medical History:  Diagnosis Date  . Abnormal EKG    December, 2013  . Anemia   . Chest pain    December, 2013  . Dyspnea   . Fibroid uterus   . GERD (gastroesophageal reflux disease)     Past Surgical History:  Procedure Laterality Date  . NO PAST SURGERIES      Current Outpatient Prescriptions  Medication Sig Dispense Refill  . docusate sodium (COLACE) 100 MG capsule Take 100 mg by mouth 2 (two) times daily.    . Iron-Vit C-Vit B12-Folic Acid (IRON 123XX123 PLUS) 100-250-0.025-1 MG TABS Take by mouth.    Lenda Kelp FE 1/20 1-20 MG-MCG tablet TAKE 1 TABLET BY MOUTH DAILY. 28 tablet 0  . Magnesium 250 MG TABS Take 250 mg by mouth daily.     Current Facility-Administered Medications  Medication Dose Route Frequency Provider Last Rate Last Dose  .  0.9 %  sodium chloride infusion  500 mL Intravenous Continuous Milus Banister, MD         ALLERGIES: Review of patient's allergies indicates no known allergies.  Family History  Problem Relation Age of Onset  . Cirrhosis Mother   . Diabetes Mother   . Stroke Mother 17  . Osteoporosis Mother   . Heart disease Maternal Grandmother   . Colon cancer Father   . Diabetes Father   . Hypertension Father     Social History   Social History  . Marital status: Single    Spouse name: n/a  . Number of children: 0  . Years of education: Master's   Occupational History  . teacher educator     [place] History Main Topics  . Smoking status: Never Smoker  . Smokeless tobacco: Never Used  . Alcohol use No  . Drug use: No  . Sexual activity: Yes    Partners: Male    Birth control/ protection:    Other Topics Concern  . Not on file   Social History Narrative   Lives with her  oldest daughter and granddaughter.    Review of Systems  Constitutional: Negative.   HENT: Negative.   Eyes: Negative.   Respiratory: Negative.   Cardiovascular: Negative.   Gastrointestinal: Negative.   Genitourinary: Negative.   Musculoskeletal: Negative.   Skin: Negative.   Neurological: Negative.   Endo/Heme/Allergies: Negative.   Psychiatric/Behavioral: Negative.     PHYSICAL EXAMINATION:    BP 124/68 (BP Location: Right Arm, Patient Position: Sitting, Cuff Size: Normal)   Pulse 80   Resp 13   Wt 154 lb (69.9 kg)   LMP 07/10/2016   BMI 26.43 kg/m     General appearance: alert, cooperative and appears stated age  ASSESSMENT Fibroid uterus, menorrhagia. Bleeding has gotten lighter on the pill H/O anemia, taking one iron tablet a day currently    PLAN Continue OCP's Will check a CBC and ferritin today  Further plans based on results   An After Visit Summary was printed and given to the patient.

## 2016-07-25 ENCOUNTER — Ambulatory Visit: Payer: BC Managed Care – PPO | Admitting: Obstetrics and Gynecology

## 2016-08-10 ENCOUNTER — Other Ambulatory Visit: Payer: Self-pay

## 2016-08-10 NOTE — Telephone Encounter (Signed)
Medication refill request: JUNEL FE 1 MG-20 MCG Last AEX:  Patient had office visit for menorrhagia with regular cycle 07/12/16 - JJ Next AEX: 05/07/17  Last MMG (if hormonal medication request): 05/16/16 BIRADS 3: probably benign Refill authorized: 07/09/16 #28 w/0 refills; today please advise; Pharmacy requests 90-day supply

## 2016-08-12 MED ORDER — NORETHIN ACE-ETH ESTRAD-FE 1-20 MG-MCG PO TABS: 1.0000 | ORAL_TABLET | Freq: Every day | ORAL | 2 refills | Status: DC

## 2016-08-12 NOTE — Telephone Encounter (Signed)
Script sent for 9 months

## 2016-12-11 ENCOUNTER — Telehealth: Payer: Self-pay | Admitting: *Deleted

## 2016-12-11 NOTE — Telephone Encounter (Signed)
Patient in 04 recall for left breast DX MMG and U/S. Left message for patient to call regarding scheduling

## 2016-12-18 NOTE — Telephone Encounter (Signed)
Left another message regarding 04 recall.

## 2016-12-24 NOTE — Telephone Encounter (Signed)
Patient has not returned call. Please advise on recall status/letter- eh

## 2016-12-24 NOTE — Telephone Encounter (Signed)
Please send the patient a letter stressing the importance of her f/u breast imaging. Please let her know that we will not be able to refill her OCP's without that imaging secondary to the risks.  Then close the encounter.

## 2016-12-25 ENCOUNTER — Encounter: Payer: Self-pay | Admitting: *Deleted

## 2016-12-25 NOTE — Telephone Encounter (Signed)
Letter sent - removed from recall -eh 

## 2017-03-20 NOTE — Telephone Encounter (Signed)
error 

## 2017-04-15 ENCOUNTER — Other Ambulatory Visit: Payer: Self-pay | Admitting: Obstetrics and Gynecology

## 2017-04-15 NOTE — Telephone Encounter (Signed)
Medication refill request: OCP Last AEX:  04/11/16 JJ Next AEX: 05/07/17 JJ Last MMG (if hormonal medication request): [place] left 05/16/16 f/u 6 months  Refill authorized: 08/12/16 #84tabs with 2 refills   "Please send the patient a letter stressing the importance of her f/u breast imaging. Please let her know that we will not be able to refill her OCP's without that imaging secondary to the risks. " JJ recall encounter from 12/11/16   Rx refused.

## 2017-05-07 ENCOUNTER — Ambulatory Visit: Payer: BC Managed Care – PPO | Admitting: Obstetrics and Gynecology

## 2017-06-25 ENCOUNTER — Other Ambulatory Visit: Payer: Self-pay | Admitting: Obstetrics and Gynecology

## 2017-06-25 DIAGNOSIS — N6320 Unspecified lump in the left breast, unspecified quadrant: Secondary | ICD-10-CM

## 2017-06-26 ENCOUNTER — Encounter: Payer: Self-pay | Admitting: Obstetrics and Gynecology

## 2017-06-26 ENCOUNTER — Ambulatory Visit (INDEPENDENT_AMBULATORY_CARE_PROVIDER_SITE_OTHER): Payer: BC Managed Care – PPO | Admitting: Obstetrics and Gynecology

## 2017-06-26 VITALS — BP 102/60 | HR 80 | Resp 12 | Wt 152.0 lb

## 2017-06-26 DIAGNOSIS — N921 Excessive and frequent menstruation with irregular cycle: Secondary | ICD-10-CM | POA: Diagnosis not present

## 2017-06-26 DIAGNOSIS — Z862 Personal history of diseases of the blood and blood-forming organs and certain disorders involving the immune mechanism: Secondary | ICD-10-CM

## 2017-06-26 DIAGNOSIS — N920 Excessive and frequent menstruation with regular cycle: Secondary | ICD-10-CM | POA: Diagnosis not present

## 2017-06-26 DIAGNOSIS — Z0000 Encounter for general adult medical examination without abnormal findings: Secondary | ICD-10-CM

## 2017-06-26 DIAGNOSIS — D259 Leiomyoma of uterus, unspecified: Secondary | ICD-10-CM | POA: Diagnosis not present

## 2017-06-26 DIAGNOSIS — Z01419 Encounter for gynecological examination (general) (routine) without abnormal findings: Secondary | ICD-10-CM | POA: Diagnosis not present

## 2017-06-26 DIAGNOSIS — Z113 Encounter for screening for infections with a predominantly sexual mode of transmission: Secondary | ICD-10-CM

## 2017-06-26 MED ORDER — NORETHINDRONE ACET-ETHINYL EST 1.5-30 MG-MCG PO TABS: 1.0000 | ORAL_TABLET | Freq: Every day | ORAL | 0 refills | Status: DC

## 2017-06-26 NOTE — Patient Instructions (Addendum)
EXERCISE AND DIET:  We recommended that you start or continue a regular exercise program for good health. Regular exercise means any activity that makes your heart beat faster and makes you sweat.  We recommend exercising at least 30 minutes per day at least 3 days a week, preferably 4 or 5.  We also recommend a diet low in fat and sugar.  Inactivity, poor dietary choices and obesity can cause diabetes, heart attack, stroke, and kidney damage, among others.    ALCOHOL AND SMOKING:  Women should limit their alcohol intake to no more than 7 drinks/beers/glasses of wine (combined, not each!) per week. Moderation of alcohol intake to this level decreases your risk of breast cancer and liver damage. And of course, no recreational drugs are part of a healthy lifestyle.  And absolutely no smoking or even second hand smoke. Most people know smoking can cause heart and lung diseases, but did you know it also contributes to weakening of your bones? Aging of your skin?  Yellowing of your teeth and nails?  CALCIUM AND VITAMIN D:  Adequate intake of calcium and Vitamin D are recommended.  The recommendations for exact amounts of these supplements seem to change often, but generally speaking 600 mg of calcium (either carbonate or citrate) and 800 units of Vitamin D per day seems prudent. Certain women may benefit from higher intake of Vitamin D.  If you are among these women, your doctor will have told you during your visit.    PAP SMEARS:  Pap smears, to check for cervical cancer or precancers,  have traditionally been done yearly, although recent scientific advances have shown that most women can have pap smears less often.  However, every woman still should have a physical exam from her gynecologist every year. It will include a breast check, inspection of the vulva and vagina to check for abnormal growths or skin changes, a visual exam of the cervix, and then an exam to evaluate the size and shape of the uterus and  ovaries.  And after 45 years of age, a rectal exam is indicated to check for rectal cancers. We will also provide age appropriate advice regarding health maintenance, like when you should have certain vaccines, screening for sexually transmitted diseases, bone density testing, colonoscopy, mammograms, etc.   MAMMOGRAMS:  All women over 40 years old should have a yearly mammogram. Many facilities now offer a "3D" mammogram, which may cost around $50 extra out of pocket. If possible,  we recommend you accept the option to have the 3D mammogram performed.  It both reduces the number of women who will be called back for extra views which then turn out to be normal, and it is better than the routine mammogram at detecting truly abnormal areas.    COLONOSCOPY:  Colonoscopy to screen for colon cancer is recommended for all women at age 50.  We know, you hate the idea of the prep.  We agree, BUT, having colon cancer and not knowing it is worse!!  Colon cancer so often starts as a polyp that can be seen and removed at colonscopy, which can quite literally save your life!  And if your first colonoscopy is normal and you have no family history of colon cancer, most women don't have to have it again for 10 years.  Once every ten years, you can do something that may end up saving your life, right?  We will be happy to help you get it scheduled when you are ready.    Be sure to check your insurance coverage so you understand how much it will cost.  It may be covered as a preventative service at no cost, but you should check your particular policy.      Breast Self-Awareness Breast self-awareness means being familiar with how your breasts look and feel. It involves checking your breasts regularly and reporting any changes to your health care provider. Practicing breast self-awareness is important. A change in your breasts can be a sign of a serious medical problem. Being familiar with how your breasts look and feel allows  you to find any problems early, when treatment is more likely to be successful. All women should practice breast self-awareness, including women who have had breast implants. How to do a breast self-exam One way to learn what is normal for your breasts and whether your breasts are changing is to do a breast self-exam. To do a breast self-exam: Look for Changes  1. Remove all the clothing above your waist. 2. Stand in front of a mirror in a room with good lighting. 3. Put your hands on your hips. 4. Push your hands firmly downward. 5. Compare your breasts in the mirror. Look for differences between them (asymmetry), such as: ? Differences in shape. ? Differences in size. ? Puckers, dips, and bumps in one breast and not the other. 6. Look at each breast for changes in your skin, such as: ? Redness. ? Scaly areas. 7. Look for changes in your nipples, such as: ? Discharge. ? Bleeding. ? Dimpling. ? Redness. ? A change in position. Feel for Changes  Carefully feel your breasts for lumps and changes. It is best to do this while lying on your back on the floor and again while sitting or standing in the shower or tub with soapy water on your skin. Feel each breast in the following way:  Place the arm on the side of the breast you are examining above your head.  Feel your breast with the other hand.  Start in the nipple area and make  inch (2 cm) overlapping circles to feel your breast. Use the pads of your three middle fingers to do this. Apply light pressure, then medium pressure, then firm pressure. The light pressure will allow you to feel the tissue closest to the skin. The medium pressure will allow you to feel the tissue that is a little deeper. The firm pressure will allow you to feel the tissue close to the ribs.  Continue the overlapping circles, moving downward over the breast until you feel your ribs below your breast.  Move one finger-width toward the center of the body.  Continue to use the  inch (2 cm) overlapping circles to feel your breast as you move slowly up toward your collarbone.  Continue the up and down exam using all three pressures until you reach your armpit.  Write Down What You Find  Write down what is normal for each breast and any changes that you find. Keep a written record with breast changes or normal findings for each breast. By writing this information down, you do not need to depend only on memory for size, tenderness, or location. Write down where you are in your menstrual cycle, if you are still menstruating. If you are having trouble noticing differences in your breasts, do not get discouraged. With time you will become more familiar with the variations in your breasts and more comfortable with the exam. How often should I examine my breasts? Examine   your breasts every month. If you are breastfeeding, the best time to examine your breasts is after a feeding or after using a breast pump. If you menstruate, the best time to examine your breasts is 5-7 days after your period is over. During your period, your breasts are lumpier, and it may be more difficult to notice changes. When should I see my health care provider? See your health care provider if you notice:  A change in shape or size of your breasts or nipples.  A change in the skin of your breast or nipples, such as a reddened or scaly area.  Unusual discharge from your nipples.  A lump or thick area that was not there before.  Pain in your breasts.  Anything that concerns you.  This information is not intended to replace advice given to you by your health care provider. Make sure you discuss any questions you have with your health care provider. Document Released: 09/24/2005 Document Revised: 03/01/2016 Document Reviewed: 08/14/2015 Elsevier Interactive Patient Education  2018 Elsevier Inc.  Oral Contraception Information Oral contraceptive pills (OCPs) are medicines  taken to prevent pregnancy. OCPs work by preventing the ovaries from releasing eggs. The hormones in OCPs also cause the cervical mucus to thicken, preventing the sperm from entering the uterus. The hormones also cause the uterine lining to become thin, not allowing a fertilized egg to attach to the inside of the uterus. OCPs are highly effective when taken exactly as prescribed. However, OCPs do not prevent sexually transmitted diseases (STDs). Safe sex practices, such as using condoms along with the pill, can help prevent STDs. Before taking the pill, you may have a physical exam and Pap test. Your health care provider may order blood tests. The health care provider will make sure you are a good candidate for oral contraception. Discuss with your health care provider the possible side effects of the OCP you may be prescribed. When starting an OCP, it can take 2 to 3 months for the body to adjust to the changes in hormone levels in your body. Types of oral contraception  The combination pill-This pill contains estrogen and progestin (synthetic progesterone) hormones. The combination pill comes in 21-day, 28-day, or 91-day packs. Some types of combination pills are meant to be taken continuously (365-day pills). With 21-day packs, you do not take pills for 7 days after the last pill. With 28-day packs, the pill is taken every day. The last 7 pills are without hormones. Certain types of pills have more than 21 hormone-containing pills. With 91-day packs, the first 84 pills contain both hormones, and the last 7 pills contain no hormones or contain estrogen only.  The minipill-This pill contains the progesterone hormone only. The pill is taken every day continuously. It is very important to take the pill at the same time each day. The minipill comes in packs of 28 pills. All 28 pills contain the hormone. Advantages of oral contraceptive pills  Decreases premenstrual symptoms.  Treats menstrual period  cramps.  Regulates the menstrual cycle.  Decreases a heavy menstrual flow.  May treatacne, depending on the type of pill.  Treats abnormal uterine bleeding.  Treats polycystic ovarian syndrome.  Treats endometriosis.  Can be used as emergency contraception. Things that can make oral contraceptive pills less effective OCPs can be less effective if:  You forget to take the pill at the same time every day.  You have a stomach or intestinal disease that lessens the absorption of the pill.    You take OCPs with other medicines that make OCPs less effective, such as antibiotics, certain HIV medicines, and some seizure medicines.  You take expired OCPs.  You forget to restart the pill on day 7, when using the packs of 21 pills.  Risks associated with oral contraceptive pills Oral contraceptive pills can sometimes cause side effects, such as:  Headache.  Nausea.  Breast tenderness.  Irregular bleeding or spotting.  Combination pills are also associated with a small increased risk of:  Blood clots.  Heart attack.  Stroke.  This information is not intended to replace advice given to you by your health care provider. Make sure you discuss any questions you have with your health care provider. Document Released: 12/15/2002 Document Revised: 03/01/2016 Document Reviewed: 03/15/2013 Elsevier Interactive Patient Education  2018 Elsevier Inc.  

## 2017-06-26 NOTE — Progress Notes (Signed)
46 y.o. [id number] SingleAfrican AmericanF here for annual exam.  The patient was started on OCP's last year to treat her fibroid uterus, menorrhagia and anemia. Last fall after being on the pill for several months, she was no longer anemic. She ran out of pills in July. By the end of taking her pills she was spotting most of the month. Taking her pills at the same time every day. She was still getting a monthly cycle, lasted for 3-4 days. On the pill she was saturating a super tampon in up to 6-8 hours. No cramps. Since she ran out of the pill, she has had 2 cycles about a month apart x 6 days. 2-3 heavy days. Saturating a super tampon and an overnight pad every 3 hours. No cramps. No spotting. Not light headed or dizzy. She is getting SOB on exertion. Not currently consistent with the iron pills. Sexually active, same partner x 6 months. Using condoms. No pain.  No abdominal pressure. No change in baseline frequent urination. Nocturia x 1. No leakage. Normal BM.  Period Duration (Days): spotting all month on OCP Period Pattern: (!) Irregular Menstrual Flow: Light Menstrual Control: Thin pad Dysmenorrhea: None  Patient's last menstrual period was 06/01/2017 (approximate).          Sexually active: Yes.    The current method of family planning is condoms most of the time.   Exercising: No.  The patient does not participate in regular exercise at present. Smoker:  no  Health Maintenance: Pap:  04-11-16 WNL NEG HR HPV History of abnormal Pap:  Yes 20+ yrs ago MMG:  05-16-16 Follow-up left breast diagnostic mammogram, and possible ultrasound, in 6 months.- appointment this Friday for F/U Colonoscopy:  05-09-16 diverticulosis BMD:   Never TDaP:  06-10-15 Gardasil: N/A   reports that she has never smoked. She has never used smokeless tobacco. She reports that she does not drink alcohol or use drugs. 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 Daughters are 36 and 22. 1 grand daughter, 16 years old, lives locally  with her older daughter.   Past Medical History:  Diagnosis Date  . Abnormal EKG    December, 2013  . Anemia   . Chest pain    December, 2013  . Dyspnea   . Fibroid uterus   . GERD (gastroesophageal reflux disease)     Past Surgical History:  Procedure Laterality Date  . NO PAST SURGERIES      Current Outpatient Prescriptions  Medication Sig Dispense Refill  . Iron-Vit C-Vit B12-Folic Acid (IRON 101 PLUS) 100-250-0.025-1 MG TABS Take by mouth.    . norethindrone-ethinyl estradiol (JUNEL FE 1/20) 1-20 MG-MCG tablet Take 1 tablet by mouth daily. (Patient not taking: Reported on 06/26/2017) 84 tablet 2   Current Facility-Administered Medications  Medication Dose Route Frequency Provider Last Rate Last Dose  . 0.9 %  sodium chloride infusion  500 mL Intravenous Continuous Milus Banister, MD        Family History  Problem Relation Age of Onset  . Cirrhosis Mother   . Diabetes Mother   . Stroke Mother 15  . Osteoporosis Mother   . Heart disease Maternal Grandmother   . Colon cancer Father   . Diabetes Father   . Hypertension Father     Review of Systems  Constitutional: Negative.   HENT: Negative.   Eyes: Negative.   Respiratory: Negative.   Cardiovascular: Negative.   Gastrointestinal: Negative.   Endocrine: Negative.   Genitourinary: Positive  for menstrual problem.       Irregular uterine bleeding - continuous spotting on OCP Heavy bleeding off OCP  Musculoskeletal: Negative.   Skin: Positive for rash.       Rash back   Allergic/Immunologic: Negative.   Neurological: Negative.   Psychiatric/Behavioral: Negative.   She has a rash on her back, saw a dermatologist last year. He offered her a topical cream or an oral medication.   Exam:   BP 102/60 (BP Location: Right Arm, Patient Position: Sitting, Cuff Size: Normal)   Pulse 80   Resp 12   Wt 152 lb (68.9 kg)   LMP 06/01/2017 (Approximate)   BMI 26.09 kg/m   Weight change: @WEIGHTCHANGE @ Height:      Ht  Readings from Last 3 Encounters:  05/09/16 5\' 4"  (1.626 m)  04/11/16 5' 3.75" (1.619 m)  03/28/16 5\' 4"  (1.626 m)    General appearance: alert, cooperative and appears stated age Head: Normocephalic, without obvious abnormality, atraumatic Neck: no adenopathy, supple, symmetrical, trachea midline and thyroid normal to inspection and palpation Lungs: clear to auscultation bilaterally Cardiovascular: regular rate and rhythm Breasts: normal appearance, no masses or tenderness Abdomen: soft, non-tender; non distended,  no masses,  no organomegaly Extremities: extremities normal, atraumatic, no cyanosis or edema Skin: Skin color, texture, turgor normal. No rashes or lesions Lymph nodes: Cervical, supraclavicular, and axillary nodes normal. No abnormal inguinal nodes palpated Neurologic: Grossly normal   Pelvic: External genitalia:  no lesions              Urethra:  normal appearing urethra with no masses, tenderness or lesions              Bartholins and Skenes: normal                 Vagina: normal appearing vagina with normal color and discharge, no lesions              Cervix: no lesions               Bimanual Exam:  Uterus:  uterus is 10-12 week sized, anteverted, irregular shaped, mobile, not tender. Not filling her pelvis              Adnexa: no mass, fullness, tenderness               Rectovaginal: Confirms               Anus:  normal sphincter tone, no lesions  Chaperone was present for exam.  A:  Well Woman with normal exam  Fibroid uterus, menorrhagia  H/O anemia  She was better on OCP's other than BTB  P:   No pap this year  CBC, Ferritin  Screening labs  Screening STD  Mammogram scheduled  Discussed breast self exam  Discussed calcium and vit D intake  Will restart OCP's, will change to loestrin 1.5/30 (no contraindications, risks reviewed)

## 2017-06-27 LAB — COMPREHENSIVE METABOLIC PANEL
ALBUMIN: 4.4 g/dL (ref 3.5–5.5)
ALT: 13 IU/L (ref 0–32)
AST: 15 IU/L (ref 0–40)
Albumin/Globulin Ratio: 1.4 (ref 1.2–2.2)
Alkaline Phosphatase: 60 IU/L (ref 39–117)
BUN / CREAT RATIO: 15 (ref 9–23)
BUN: 13 mg/dL (ref 6–24)
Bilirubin Total: 0.2 mg/dL (ref 0.0–1.2)
CALCIUM: 9.4 mg/dL (ref 8.7–10.2)
CO2: 22 mmol/L (ref 20–29)
CREATININE: 0.88 mg/dL (ref 0.57–1.00)
Chloride: 100 mmol/L (ref 96–106)
GFR calc Af Amer: 92 mL/min/{1.73_m2} (ref >59)
GFR, EST NON AFRICAN AMERICAN: 80 mL/min/{1.73_m2} (ref >59)
GLOBULIN, TOTAL: 3.2 g/dL (ref 1.5–4.5)
Glucose: 82 mg/dL (ref 65–99)
Potassium: 4.5 mmol/L (ref 3.5–5.2)
SODIUM: 138 mmol/L (ref 134–144)
TOTAL PROTEIN: 7.6 g/dL (ref 6.0–8.5)

## 2017-06-27 LAB — CBC
HEMATOCRIT: 32.5 % — AB (ref 34.0–46.6)
HEMOGLOBIN: 10.3 g/dL — AB (ref 11.1–15.9)
MCH: 26.7 pg (ref 26.6–33.0)
MCHC: 31.7 g/dL (ref 31.5–35.7)
MCV: 84 fL (ref 79–97)
Platelets: 308 10*3/uL (ref 150–379)
RBC: 3.86 x10E6/uL (ref 3.77–5.28)
RDW: 14.8 % (ref 12.3–15.4)
WBC: 6 10*3/uL (ref 3.4–10.8)

## 2017-06-27 LAB — HEP, RPR, HIV PANEL
HIV SCREEN 4TH GENERATION: NONREACTIVE
Hepatitis B Surface Ag: NEGATIVE
RPR Ser Ql: NONREACTIVE

## 2017-06-27 LAB — FERRITIN: FERRITIN: 9 ng/mL — AB (ref 15–150)

## 2017-06-27 LAB — LIPID PANEL
Chol/HDL Ratio: 2.5 ratio (ref 0.0–4.4)
Cholesterol, Total: 159 mg/dL (ref 100–199)
HDL: 63 mg/dL (ref >39)
LDL Calculated: 81 mg/dL (ref 0–99)
TRIGLYCERIDES: 73 mg/dL (ref 0–149)
VLDL Cholesterol Cal: 15 mg/dL (ref 5–40)

## 2017-06-27 LAB — HEPATITIS C ANTIBODY

## 2017-06-27 LAB — GC/CHLAMYDIA PROBE AMP
Chlamydia trachomatis, NAA: NEGATIVE
Neisseria gonorrhoeae by PCR: NEGATIVE

## 2017-06-28 ENCOUNTER — Ambulatory Visit
Admission: RE | Admit: 2017-06-28 | Discharge: 2017-06-28 | Disposition: A | Discharge status: Home / Self Care | Payer: BC Managed Care – PPO | Source: Ambulatory Visit | Attending: Obstetrics and Gynecology | Admitting: Obstetrics and Gynecology

## 2017-06-28 DIAGNOSIS — N6320 Unspecified lump in the left breast, unspecified quadrant: Secondary | ICD-10-CM

## 2017-07-15 ENCOUNTER — Telehealth: Payer: Self-pay | Admitting: *Deleted

## 2017-07-15 NOTE — Telephone Encounter (Signed)
Spoke with patient and gave results and scheduled for recheck of labs. Patient states she has been bleeding since starting the new OCP. She is starting on her third week of the pack. She states it is not as heavy as she was having it. Please advise

## 2017-07-15 NOTE — Telephone Encounter (Signed)
Please advise the patient that she can have abnormal bleeding for the first 3 months of being on OCP's. It should get better every month. She should continue to take one pill a day and try to take it at the same time daily. Call if her bleeding is heavy, or if she is having any other concerns

## 2017-07-15 NOTE — Telephone Encounter (Signed)
-----   Message from Salvadore Dom, MD sent at 06/27/2017  5:45 PM EDT ----- Please advise the patient of normal results.

## 2017-07-16 NOTE — Telephone Encounter (Signed)
Tried to contact patient but the VM is full, will try again later -eh

## 2017-07-17 NOTE — Telephone Encounter (Signed)
Spoke with patient and gave recommendation per Dr. Talbert Nan. Patient voiced understanding -eh

## 2017-08-31 ENCOUNTER — Other Ambulatory Visit: Payer: Self-pay | Admitting: Obstetrics and Gynecology

## 2017-09-02 NOTE — Telephone Encounter (Signed)
Medication refill request: OCP  Last AEX:  06-26-17  Next AEX: 06-30-18  Last MMG (if hormonal medication request): 06-28-17 U/S Bilateral diagnostic mammogram and left breast ultrasound is recommended in 12 months Refill authorized: please advise

## 2017-09-15 ENCOUNTER — Telehealth: Payer: Self-pay | Admitting: Obstetrics and Gynecology

## 2017-09-15 NOTE — Telephone Encounter (Signed)
09/15/17 unable to lm re: dr cx appointment for return in about 3 months (around 09/25/2017) for f/u OCP's, fibroid uterus, menorrhagia/needs CBC and ferratin due to inclement weather/voice mail full/[place]

## 2017-09-16 ENCOUNTER — Ambulatory Visit: Payer: BC Managed Care – PPO | Admitting: Obstetrics and Gynecology

## 2017-09-17 NOTE — Telephone Encounter (Signed)
Patients mailbox full

## 2017-09-18 NOTE — Telephone Encounter (Signed)
Attempted to call and reschedule cancelled appointment but patient's voice mail box is full.

## 2017-09-19 NOTE — Telephone Encounter (Signed)
Attempted to call and reschedule cancelled appointment but patient's voice mail box is full.

## 2017-09-29 ENCOUNTER — Other Ambulatory Visit: Payer: Self-pay | Admitting: Obstetrics and Gynecology

## 2017-10-02 NOTE — Telephone Encounter (Signed)
The patient was anemic at her last visit. At that visit she was restarted on OCP's. Please set her up for a f/u appointment, so I can see how she is doing on OCP's and recheck her lab work. One pack sent.

## 2017-10-02 NOTE — Telephone Encounter (Signed)
verified pharmacy and scheduled follow up visit -eh

## 2017-10-02 NOTE — Telephone Encounter (Signed)
Medication refill request: OCP  Last AEX:  06-26-17  Next AEX: 06-30-18  Last MMG (if hormonal medication request): 06-28-17 U/S Bilateral diagnostic mammogram and left breast ultrasound is recommended in 12 months. Refill authorized: please advise

## 2017-10-09 ENCOUNTER — Other Ambulatory Visit: Payer: Self-pay

## 2017-10-09 ENCOUNTER — Encounter: Payer: Self-pay | Admitting: Obstetrics and Gynecology

## 2017-10-09 ENCOUNTER — Ambulatory Visit: Payer: BC Managed Care – PPO | Admitting: Obstetrics and Gynecology

## 2017-10-09 VITALS — BP 118/70 | HR 80 | Resp 16 | Wt 155.0 lb

## 2017-10-09 DIAGNOSIS — Z862 Personal history of diseases of the blood and blood-forming organs and certain disorders involving the immune mechanism: Secondary | ICD-10-CM | POA: Diagnosis not present

## 2017-10-09 DIAGNOSIS — Z3041 Encounter for surveillance of contraceptive pills: Secondary | ICD-10-CM | POA: Diagnosis not present

## 2017-10-09 DIAGNOSIS — N920 Excessive and frequent menstruation with regular cycle: Secondary | ICD-10-CM | POA: Diagnosis not present

## 2017-10-09 MED ORDER — NORETHINDRONE ACET-ETHINYL EST 1.5-30 MG-MCG PO TABS: 1.0000 | ORAL_TABLET | Freq: Every day | ORAL | 2 refills | Status: DC

## 2017-10-09 NOTE — Progress Notes (Signed)
GYNECOLOGY  VISIT   HPI: 47 y.o.   Single  African American  female   [id number] with Patient's last menstrual period was 09/10/2017 (within days).   here for f/u OCPS She is on OCP's to try and manage her menorrhagia from her fibroid uterus. Previously on the pill her cycle got lighter. She was restarted in 9/18, at that time she had a hgb of 10.3. Her bleeding is much better. She bleed most of the first month. She isn't spotting anymore, had a normal cycle last month, lasted x 6-7 days, lighter than normal. No cramps. Definitely an improvement. She hasn't been remembering to take her iron tablets.   GYNECOLOGIC HISTORY: Patient's last menstrual period was 09/10/2017 (within days). Contraception:OCP Menopausal hormone therapy: none        OB History    Gravida Para Term Preterm AB Living   4 2 2   2 2    SAB TAB Ectopic Multiple Live Births           2         Patient Active Problem List   Diagnosis Date Noted  . Fibroid uterus   . BMI 26.0-26.9,adult 03/28/2016  . Dyspnea on exertion 01/08/2016  . Newly recognized heart murmur 01/08/2016  . Esophageal reflux 01/08/2016    Past Medical History:  Diagnosis Date  . Abnormal EKG    December, 2013  . Anemia   . Chest pain    December, 2013  . Dyspnea   . Fibroid uterus   . GERD (gastroesophageal reflux disease)     Past Surgical History:  Procedure Laterality Date  . NO PAST SURGERIES      Current Outpatient Medications  Medication Sig Dispense Refill  . Iron-Vit C-Vit B12-Folic Acid (IRON 301 PLUS) 100-250-0.025-1 MG TABS Take by mouth.    Lenda Kelp 1.5/30 1.5-30 MG-MCG tablet TAKE 1 TABLET BY MOUTH EVERY DAY 21 tablet 0   Current Facility-Administered Medications  Medication Dose Route Frequency Provider Last Rate Last Dose  . 0.9 %  sodium chloride infusion  500 mL Intravenous Continuous Milus Banister, MD         ALLERGIES: Patient has no known allergies.  Family History  Problem Relation Age of Onset  .  Cirrhosis Mother   . Diabetes Mother   . Stroke Mother 28  . Osteoporosis Mother   . Heart disease Maternal Grandmother   . Colon cancer Father   . Diabetes Father   . Hypertension Father     Social History   Socioeconomic History  . Marital status: Single    Spouse name: n/a  . Number of children: 0  . Years of education: Master's  . Highest education level: Not on file  Social Needs  . Financial resource strain: Not on file  . Food insecurity - worry: Not on file  . Food insecurity - inability: Not on file  . Transportation needs - medical: Not on file  . Transportation needs - non-medical: Not on file  Occupational History  . Occupation: Patent attorney    Comment: [employer]  Tobacco Use  . Smoking status: Never Smoker  . Smokeless tobacco: Never Used  Substance and Sexual Activity  . Alcohol use: No    Alcohol/week: 0.0 oz  . Drug use: No  . Sexual activity: Yes    Partners: Male    Birth control/protection: Condom  Other Topics Concern  . Not on file  Social History Narrative   Lives with her  oldest daughter and granddaughter.    Review of Systems  Constitutional: Negative.   HENT: Negative.   Eyes: Negative.   Respiratory: Negative.   Cardiovascular: Negative.   Gastrointestinal: Negative.   Genitourinary: Negative.   Musculoskeletal: Negative.   Skin: Negative.   Neurological: Negative.   Endo/Heme/Allergies: Negative.   Psychiatric/Behavioral: Negative.     PHYSICAL EXAMINATION:    BP 118/70 (BP Location: Right Arm, Patient Position: Sitting, Cuff Size: Normal)   Pulse 80   Resp 16   Wt 155 lb (70.3 kg)   LMP 09/10/2017 (Within Days)   BMI 26.61 kg/m     General appearance: alert, cooperative and appears stated age  ASSESSMENT Menorrhagia, improved on OCP's Fibroid uterus H/O anemia   PLAN Continue OCP's CBC, Ferritin    An After Visit Summary was printed and given to the patient.

## 2017-10-10 LAB — CBC
Hematocrit: 34.6 % (ref 34.0–46.6)
Hemoglobin: 10.5 g/dL — ABNORMAL LOW (ref 11.1–15.9)
MCH: 23.9 pg — AB (ref 26.6–33.0)
MCHC: 30.3 g/dL — ABNORMAL LOW (ref 31.5–35.7)
MCV: 79 fL (ref 79–97)
PLATELETS: 362 10*3/uL (ref 150–379)
RBC: 4.4 x10E6/uL (ref 3.77–5.28)
RDW: 16.3 % — AB (ref 12.3–15.4)
WBC: 7.3 10*3/uL (ref 3.4–10.8)

## 2017-10-10 LAB — FERRITIN: FERRITIN: 6 ng/mL — AB (ref 15–150)

## 2018-01-10 ENCOUNTER — Other Ambulatory Visit: Payer: BC Managed Care – PPO

## 2018-01-10 DIAGNOSIS — Z862 Personal history of diseases of the blood and blood-forming organs and certain disorders involving the immune mechanism: Secondary | ICD-10-CM

## 2018-01-11 LAB — CBC
Hematocrit: 38.4 % (ref 34.0–46.6)
Hemoglobin: 12.7 g/dL (ref 11.1–15.9)
MCH: 29.7 pg (ref 26.6–33.0)
MCHC: 33.1 g/dL (ref 31.5–35.7)
MCV: 90 fL (ref 79–97)
PLATELETS: 270 10*3/uL (ref 150–379)
RBC: 4.27 x10E6/uL (ref 3.77–5.28)
RDW: 16.2 % — AB (ref 12.3–15.4)
WBC: 3.7 10*3/uL (ref 3.4–10.8)

## 2018-01-11 LAB — FERRITIN: Ferritin: 37 ng/mL (ref 15–150)

## 2018-05-15 IMAGING — CT CT ABD-PELV W/O CM
2 of 4 series · 17 of 46 positions shown, 19 images · non-contrast
Comparison: None.

CLINICAL DATA: Right lower quadrant abdominal pain, elevated white
blood cell count

EXAM:
CT ABDOMEN AND PELVIS WITHOUT CONTRAST
TECHNIQUE: Multidetector CT imaging of the abdomen and pelvis was performed
following the standard protocol without IV contrast.

[Series 2: rtn a/p w/o · axial · non-contrast · 0.74mm/px · z∈[-477,-87]mm · 14 of 88 slices shown, 16 images]
[im 5/88  soft-tissue]
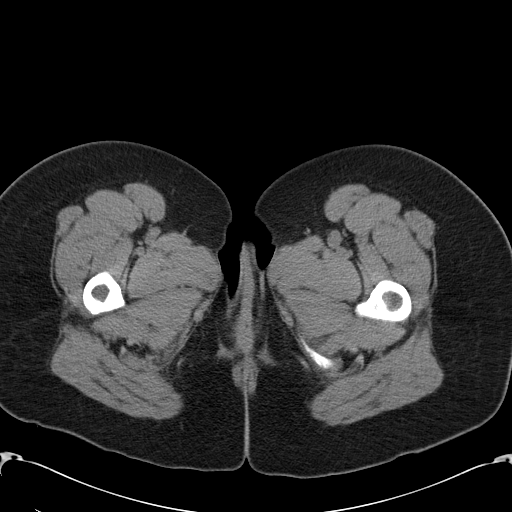
[im 5/88  bone]
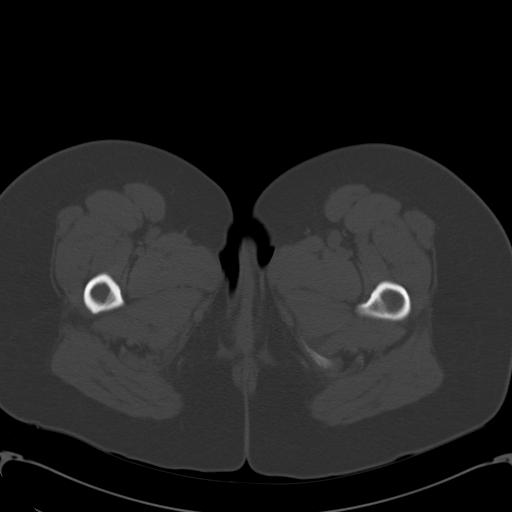
[im 14/88  soft-tissue]
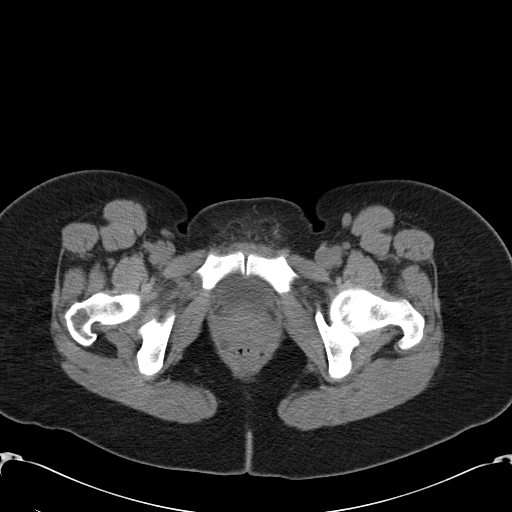
[im 18/88  soft-tissue]
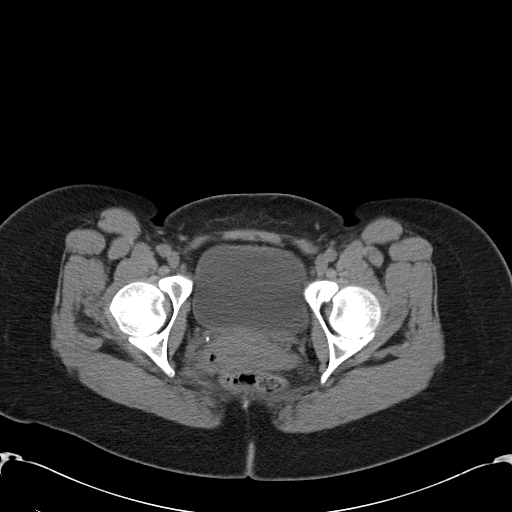
[im 22/88  soft-tissue]
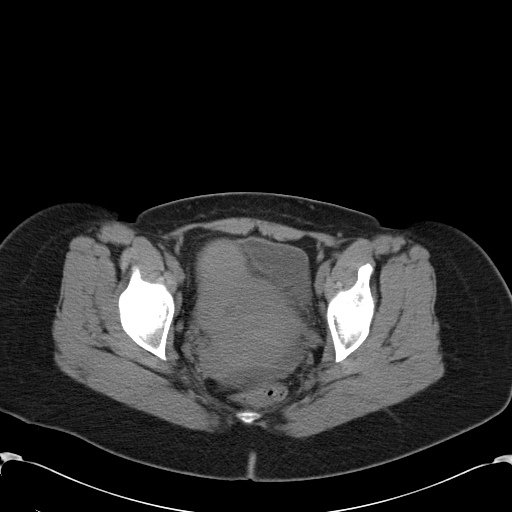
[im 31/88  soft-tissue]
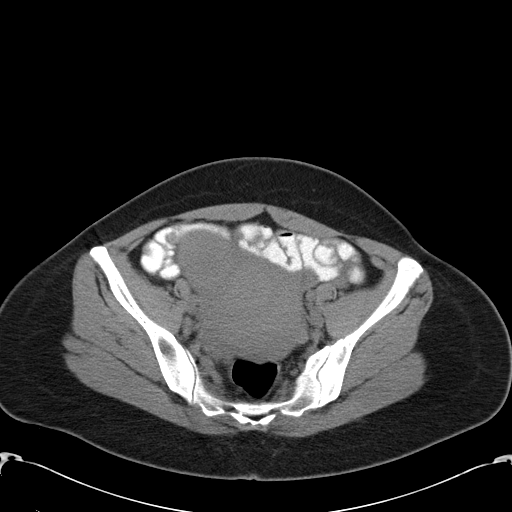
[im 35/88  soft-tissue]
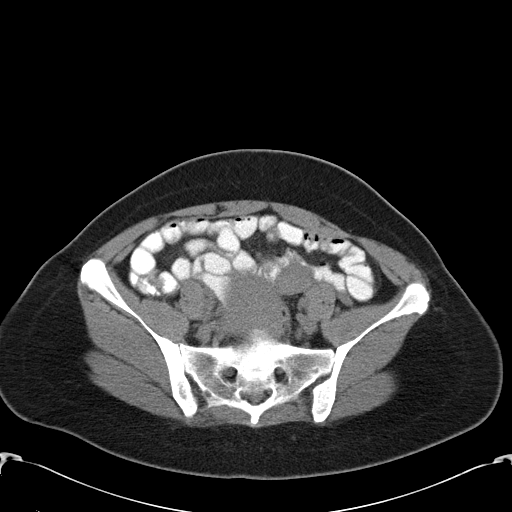
[im 40/88  soft-tissue]
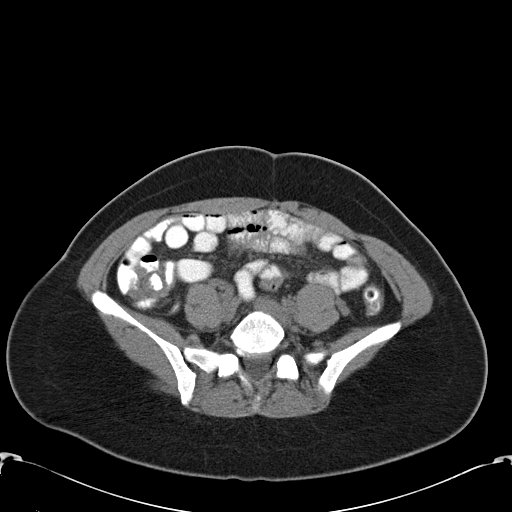
[im 48/88  soft-tissue]
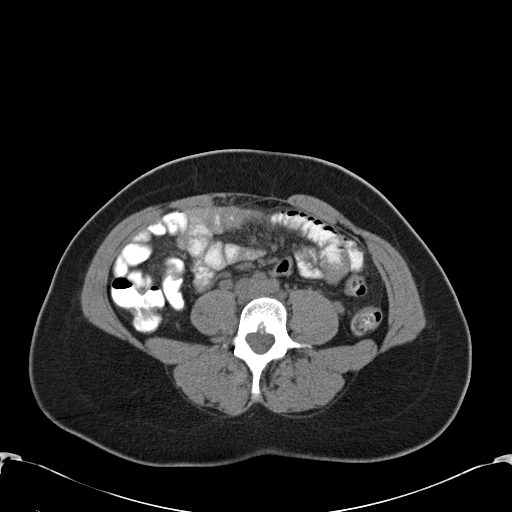
[im 53/88  soft-tissue]
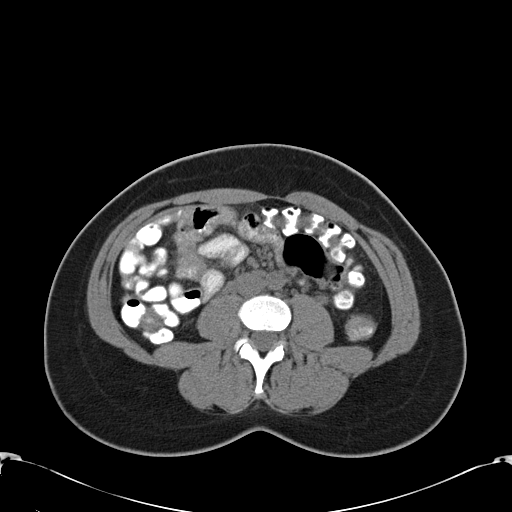
[im 53/88  bone]
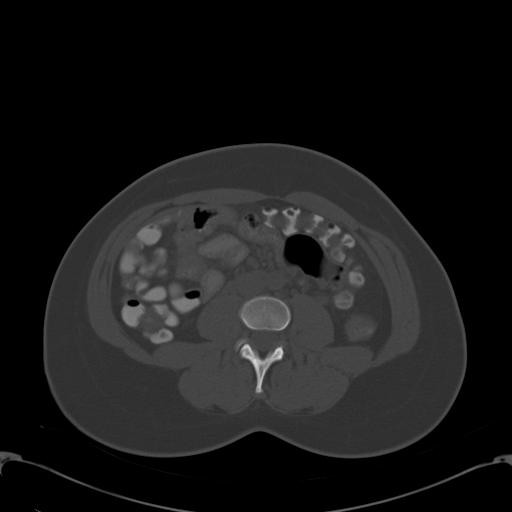
[im 57/88  soft-tissue]
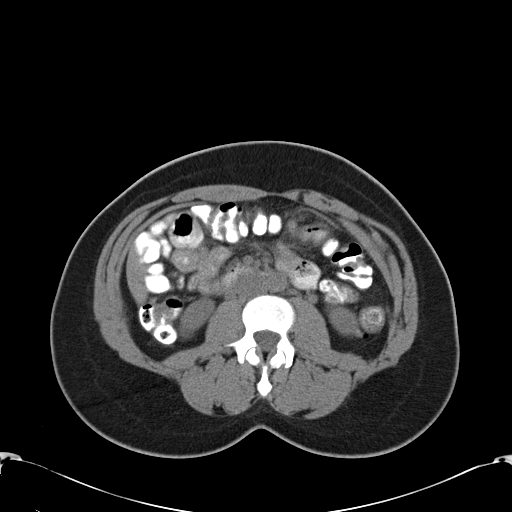
[im 66/88  soft-tissue]
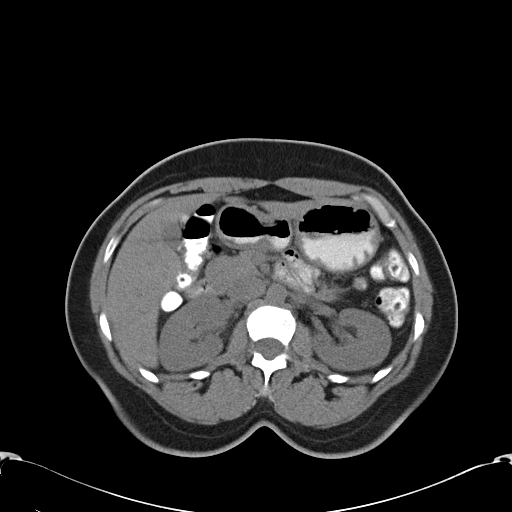
[im 70/88  soft-tissue]
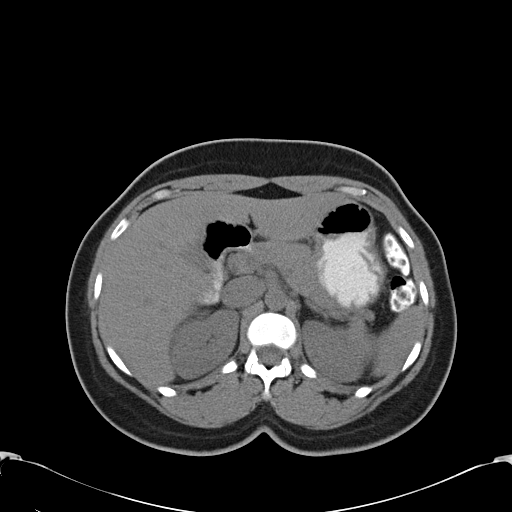
[im 74/88  soft-tissue]
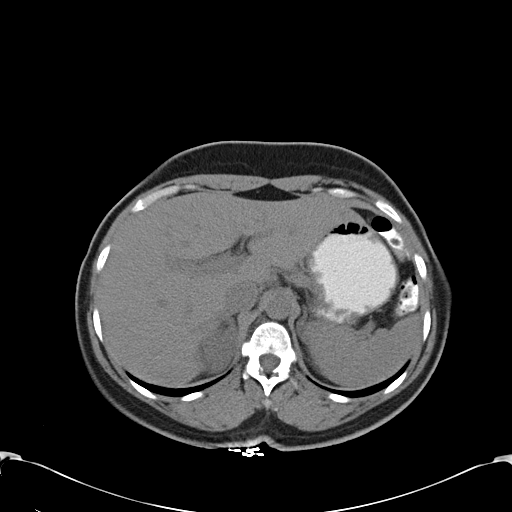
[im 83/88  soft-tissue]
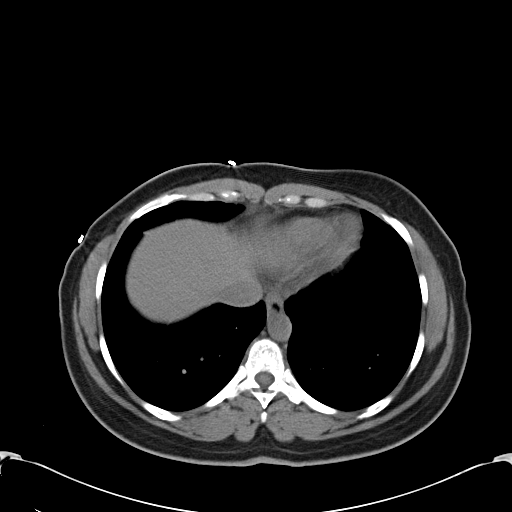

[Series 603: <mpr thick range(1)> · coronal · 0.86mm/px · 3 of 123 slices shown]
[im 41/123  soft-tissue]
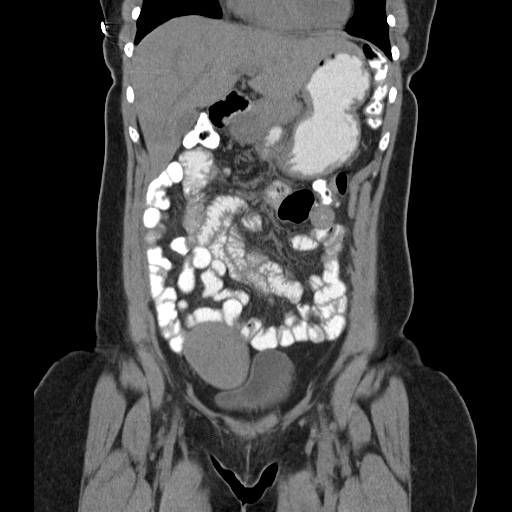
[im 55/123  soft-tissue]
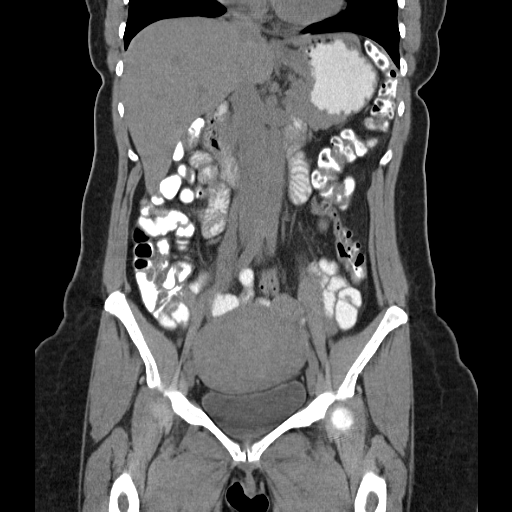
[im 68/123  soft-tissue]
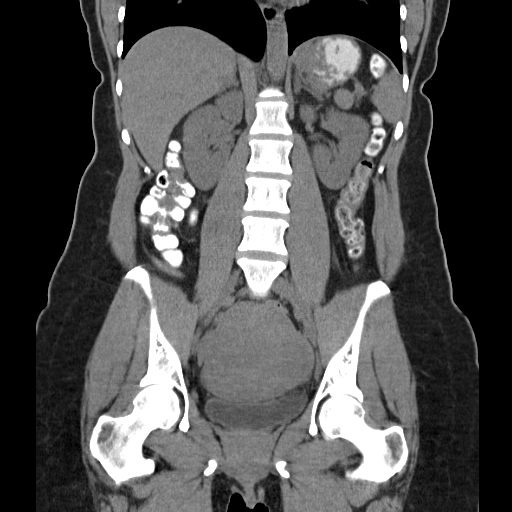

[17 of 46 positions shown; findings below may reference images not displayed]

FINDINGS: The lung bases are clear. The liver is unremarkable in the
unenhanced state. No calcified gallstones are seen within the
contracted gallbladder. The pancreas is normal in size and the
pancreatic duct is not dilated. The adrenal glands and spleen are
unremarkable. The stomach is moderately distended with oral
contrast. No renal calculi are seen and there is no evidence of
hydronephrosis. The abdominal aorta is normal in caliber.

The uterus is enlarged and lobular, consistent with multiple
fibroids. The largest of these fibroids extends anteriorly to the
right measuring 5.3 cm. Ultrasound of the pelvis may be help to
assess more sensitively. A moderate amount of free fluid is noted
layering dependently within the pelvis. No adnexal lesion is seen
although the adnexa are not well seen due to the enlarged lobular
uterus. The urinary bladder is unremarkable. The colon is largely
decompressed. The terminal ileum and the appendix are unremarkable.
The lumbar vertebrae are in normal alignment with normal
intervertebral disc spaces.
IMPRESSION: 1. The uterus is enlarged and lobular consistent with multiple
fibroids. Consider pelvic ultrasound to assess further if warranted.
Moderate amount of free fluid in the pelvis.
2. The appendix and terminal ileum are unremarkable.
3. No renal or ureteral calculi are seen.

## 2018-06-02 ENCOUNTER — Other Ambulatory Visit: Payer: Self-pay | Admitting: Obstetrics and Gynecology

## 2018-06-02 NOTE — Telephone Encounter (Signed)
Medication refill request: Junel Last AEX:  06/25/2018 Next AEX: 06/30/2018 Last MMG (if hormonal medication request): 06/28/2017 Refill authorized: #[id number], 3 refills,  Please advise.

## 2018-06-25 ENCOUNTER — Telehealth: Payer: Self-pay | Admitting: Obstetrics and Gynecology

## 2018-06-25 DIAGNOSIS — D259 Leiomyoma of uterus, unspecified: Secondary | ICD-10-CM

## 2018-06-25 DIAGNOSIS — Z862 Personal history of diseases of the blood and blood-forming organs and certain disorders involving the immune mechanism: Secondary | ICD-10-CM

## 2018-06-25 DIAGNOSIS — N920 Excessive and frequent menstruation with regular cycle: Secondary | ICD-10-CM

## 2018-06-25 NOTE — Telephone Encounter (Signed)
Call to patient. Patient requesting to have blood work on 06-27-18 so that Dr. Talbert Nan can review results with her at appointment on 06-30-18. Patient states she has felt really weak the last few weeks and has been on her period for 5 weeks. Patient asking to have "whatever labs" Dr. Talbert Nan recommends annually done. RN advised would review with Dr. Talbert Nan and return call. Patient agreeable.   Dr. Talbert Nan- please advise on labs?

## 2018-06-25 NOTE — Telephone Encounter (Signed)
Spoke with patient. She states she had a few months of bleeding that was not too heavy, had a cycle and then had a break and then began to have vaginal bleeding 06/20/18. States that she is wearing extra coverage because sometimes clots are "pushing" tampons out.  She has decreased her activity due to feeling weak sometimes, but she states she thinks bleeding is "slowing down." Verbalizes that bleeding has decreased since it started on Friday.   She is still taking her OCP. She has not missed any pills, no late pills. Not currently sexually active.   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 Requests appointment on Friday due to her schedule, advised patient that she needs to be evaluated tomorrow or tonight if symptoms worsen. Pt declines all offers of earlier appointment for evaluation, advised may need to be transferred to [facility] from office visit depending on results.   Advised patient to call back or seek immediate medical care if bleeding worsens or soaking through 1 pad/tampon per hour for two hours or if becomes symptomatic with sob, chest pain, fatigued, lightheaded, or weakness.  Advised patient if she is driving and feels weak needs to pull over to the side of the road and call 911.  Patient verbalized understanding of all emergency symptoms and when to seek emergency care.   Reviewed with Dr. Talbert Nan after call, lab work ordered per Dr. Talbert Nan  and will close encounter as patient was given and verbalizes understanding of emergency precautions.

## 2018-06-25 NOTE — Telephone Encounter (Signed)
Please check how heavily she is bleeding. If she is going through a pad an hour, is light headed or dizzy she should wait for evaluation. I'm happy to see her tomorrow as well.   Is she still on OCP's? Please order a CBC, Ferritin, CMP Find out if she wants STD testing. If so, I would add an HIV and RPR

## 2018-06-25 NOTE — Telephone Encounter (Signed)
Patient has an aex 06/30/18 with Dr.Jertson and is asking to come in 06/27/18 to have her "iron level checked".

## 2018-06-26 ENCOUNTER — Other Ambulatory Visit (HOSPITAL_COMMUNITY)
Admission: RE | Admit: 2018-06-26 | Discharge: 2018-06-26 | Disposition: A | Discharge status: Home / Self Care | Payer: BC Managed Care – PPO | Source: Ambulatory Visit | Attending: Obstetrics and Gynecology | Admitting: Obstetrics and Gynecology

## 2018-06-26 ENCOUNTER — Ambulatory Visit: Payer: BC Managed Care – PPO | Admitting: Obstetrics & Gynecology

## 2018-06-26 ENCOUNTER — Encounter: Payer: Self-pay | Admitting: Obstetrics & Gynecology

## 2018-06-26 VITALS — BP 130/70 | HR 96 | Resp 16 | Ht 64.0 in | Wt 150.4 lb

## 2018-06-26 DIAGNOSIS — D259 Leiomyoma of uterus, unspecified: Secondary | ICD-10-CM | POA: Diagnosis not present

## 2018-06-26 DIAGNOSIS — Z124 Encounter for screening for malignant neoplasm of cervix: Secondary | ICD-10-CM | POA: Diagnosis not present

## 2018-06-26 DIAGNOSIS — D500 Iron deficiency anemia secondary to blood loss (chronic): Secondary | ICD-10-CM

## 2018-06-26 DIAGNOSIS — Z862 Personal history of diseases of the blood and blood-forming organs and certain disorders involving the immune mechanism: Secondary | ICD-10-CM

## 2018-06-26 DIAGNOSIS — N920 Excessive and frequent menstruation with regular cycle: Secondary | ICD-10-CM

## 2018-06-26 LAB — HEMOGLOBIN: HEMOGLOBIN: 7.4

## 2018-06-26 MED ORDER — NORETHINDRONE-ETH ESTRADIOL 1-35 MG-MCG PO TABS: 1.0000 | ORAL_TABLET | Freq: Every day | ORAL | 3 refills | Status: DC

## 2018-06-26 NOTE — Progress Notes (Deleted)
GYNECOLOGY  VISIT   HPI: 47 y.o.   Single Black or [place] American  female   [phone] with No LMP recorded.   here vaginal bleeding     GYNECOLOGIC HISTORY: No LMP recorded. Contraception: condom Menopausal hormone therapy: none        OB History    Gravida  4   Para  2   Term  2   Preterm      AB  2   Living  2     SAB      TAB      Ectopic      Multiple      Live Births  2              Patient Active Problem List   Diagnosis Date Noted  . Fibroid uterus   . BMI 26.0-26.9,adult 03/28/2016  . Dyspnea on exertion 01/08/2016  . Newly recognized heart murmur 01/08/2016  . Esophageal reflux 01/08/2016    Past Medical History:  Diagnosis Date  . Abnormal EKG    December, 2013  . Anemia   . Chest pain    December, 2013  . Dyspnea   . Fibroid uterus   . GERD (gastroesophageal reflux disease)     Past Surgical History:  Procedure Laterality Date  . NO PAST SURGERIES      Current Outpatient Medications  Medication Sig Dispense Refill  . Iron-Vit C-Vit B12-Folic Acid (IRON 474 PLUS) 100-250-0.025-1 MG TABS Take by mouth.    Lenda Kelp 1.5/30 1.5-30 MG-MCG tablet TAKE 1 TABLET BY MOUTH EVERY DAY 1 Package 0   Current Facility-Administered Medications  Medication Dose Route Frequency Provider Last Rate Last Dose  . 0.9 %  sodium chloride infusion  500 mL Intravenous Continuous Milus Banister, MD         ALLERGIES: Patient has no known allergies.  Family History  Problem Relation Age of Onset  . Cirrhosis Mother   . Diabetes Mother   . Stroke Mother 69  . Osteoporosis Mother   . Heart disease Maternal Grandmother   . Colon cancer Father   . Diabetes Father   . Hypertension Father     Social History   Socioeconomic History  . Marital status: Single    Spouse name: n/a  . Number of children: 0  . Years of education: Master's  . Highest education level: Not on file  Occupational History  . Occupation: Patent attorney    Comment:  [employer]  . Financial resource strain: Not on file  . Food insecurity:    Worry: Not on file    Inability: Not on file  . Transportation needs:    Medical: Not on file    Non-medical: Not on file  Tobacco Use  . Smoking status: Never Smoker  . Smokeless tobacco: Never Used  Substance and Sexual Activity  . Alcohol use: No    Alcohol/week: 0.0 standard drinks  . Drug use: No  . Sexual activity: Yes    Partners: Male    Birth control/protection: Condom  Lifestyle  . Physical activity:    Days per week: Not on file    Minutes per session: Not on file  . Stress: Not on file  Relationships  . Social connections:    Talks on phone: Not on file    Gets together: Not on file    Attends religious service: Not on file    Active member of club or organization:  Not on file    Attends meetings of clubs or organizations: Not on file    Relationship status: Not on file  . Intimate partner violence:    Fear of current or ex partner: Not on file    Emotionally abused: Not on file    Physically abused: Not on file    Forced sexual activity: Not on file  Other Topics Concern  . Not on file  Social History Narrative   Lives with her oldest daughter and granddaughter.    Review of Systems  PHYSICAL EXAMINATION:    There were no vitals taken for this visit.    General appearance: alert, cooperative and appears stated age Neck: no adenopathy, supple, symmetrical, trachea midline and thyroid {CHL AMB PHY EX THYROID NORM DEFAULT:[phone]::"normal to inspection and palpation"} Breasts: {Exam; breast:13139::"normal appearance, no masses or tenderness"} Abdomen: soft, non-tender; non distended, no masses,  no organomegaly  Pelvic: External genitalia:  no lesions              Urethra:  normal appearing urethra with no masses, tenderness or lesions              Bartholins and Skenes: normal                 Vagina: normal appearing vagina with normal color and  discharge, no lesions              Cervix: {CHL AMB PHY EX CERVIX NORM DEFAULT:[phone]::"no lesions"}              Bimanual Exam:  Uterus:  {CHL AMB PHY EX UTERUS NORM DEFAULT:[phone]::"normal size, contour, position, consistency, mobility, non-tender"}              Adnexa: {CHL AMB PHY EX ADNEXA NO MASS DEFAULT:[phone]::"no mass, fullness, tenderness"}              Rectovaginal: {yes no:[id number]}.  Confirms.              Anus:  normal sphincter tone, no lesions  Chaperone was present for exam.  ASSESSMENT     PLAN    An After Visit Summary was printed and given to the patient.  *** minutes face to face time of which over 50% was spent in counseling.

## 2018-06-26 NOTE — Progress Notes (Signed)
GYNECOLOGY  VISIT  CC:   Heavy vaginal bleeding  HPI: 47 y.o. [id number] Single Black or African American female here for vaginal bleeding.  Pt has hx of uterine fibroids.  Bleeding has been heavy in the past and she was placed on OCPs.  Initially, she was on a 7mcg pill.  Cycle improved initially but then the cycle worsened again so OCP was increasd to 61mcg pill.  She's had anemia in the past with a CBC in past with hemoglobin of 10.3 in September, 2018.  She has been on iron.  CBC was 10.5 with repeat lab work Jan, 2019.  Ferritin level was 6 at that time as well.   Pt reports over the past few months, the cycles have been worsening.  She is currently on her placebo week.  Flow has been getting heavier these past several months.  She does pass clots when she is on her cycle.  This week has been particularly heavy although today is better.  Flow is not near as heavy as it has been.  She is having some clots.  She is having weakness and fatigue.  However, she denies any SOB or palpitations.  She is taking iron 3 times daily.   She should restart her OCP tomorrow.    Ultrasound 7/17.  Uterus 13 x 11 x 6cm.  Normal ovaries.  Normal endometrium.  Multiple fibroids noted with largest 5.7 x 6.0 and at least five more that are 3cm each.  GYNECOLOGIC HISTORY: Patient's last menstrual period was 06/20/2018 (exact date). Contraception: ocp Menopausal hormone therapy: none  Patient Active Problem List   Diagnosis Date Noted  . Fibroid uterus   . BMI 26.0-26.9,adult 03/28/2016  . Dyspnea on exertion 01/08/2016  . Newly recognized heart murmur 01/08/2016  . Esophageal reflux 01/08/2016    Past Medical History:  Diagnosis Date  . Abnormal EKG    December, 2013  . Anemia   . Chest pain    December, 2013  . Dyspnea   . Fibroid uterus   . GERD (gastroesophageal reflux disease)     Past Surgical History:  Procedure Laterality Date  . NO PAST SURGERIES      MEDS:   Current Outpatient  Medications on File Prior to Visit  Medication Sig Dispense Refill  . amoxicillin (AMOXIL) 500 MG capsule Take 500 mg by mouth 2 (two) times daily.    . ferrous sulfate 325 (65 FE) MG EC tablet Take 325 mg by mouth 3 (three) times daily with meals.    Marland Kitchen ibuprofen (ADVIL,MOTRIN) 800 MG tablet Take 1 tablet by mouth daily as needed.  0  . JUNEL 1.5/30 1.5-30 MG-MCG tablet TAKE 1 TABLET BY MOUTH EVERY DAY 1 Package 0  . Multiple Vitamin (MULTIVITAMIN) tablet Take 1 tablet by mouth daily.     Current Facility-Administered Medications on File Prior to Visit  Medication Dose Route Frequency Provider Last Rate Last Dose  . 0.9 %  sodium chloride infusion  500 mL Intravenous Continuous Milus Banister, MD        ALLERGIES: Patient has no known allergies.  Family History  Problem Relation Age of Onset  . Cirrhosis Mother   . Diabetes Mother   . Stroke Mother 19  . Osteoporosis Mother   . Heart disease Maternal Grandmother   . Colon cancer Father   . Diabetes Father   . Hypertension Father     SH:  Single, non smoker  Review of Systems  Genitourinary: Positive for  menstrual problem, vaginal bleeding and vaginal discharge.  All other systems reviewed and are negative.   PHYSICAL EXAMINATION:    BP 130/70 (BP Location: Right Arm, Patient Position: Sitting, Cuff Size: Normal)   Pulse 96   Resp 16   Ht 5\' 4"  (1.626 m)   Wt 150 lb 6.4 oz (68.2 kg)   LMP 06/20/2018 (Exact Date)   BMI 25.82 kg/m     General appearance: alert, cooperative and appears stated age CV:  Regular rate and rhythm Lungs:  clear to auscultation, no wheezes, rales or rhonchi, symmetric air entry Abdomen: soft, non-tender; bowel sounds normal; no masses,  no organomegaly Lymph:  no inguinal LAD noted  Pelvic: External genitalia:  no lesions              Urethra:  normal appearing urethra with no masses, tenderness or lesions              Bartholins and Skenes: normal                 Vagina: normal appearing  vagina with normal color and discharge, no lesions, bleeding is minimal but she does have some small clots coming from os with placement of speculum              Cervix: no lesions              Bimanual Exam:  Uterus:  enlarged, 14 weeks size, very firm, fills pelvis, cannot get uterus to lift out of pelvis              Adnexa: no mass, fullness, tenderness and examination of adnexa difficult due to enlarged uterus    Endometrial biopsy recommended.  Discussed with patient.  Verbal and written consent obtained.   Procedure:  Speculum placed.  Cervix visualized and cleansed with betadine prep.  A single toothed tenaculum was applied to the anterior lip of the cervix.  Endometrial pipelle was advanced through the cervix into the endometrial cavity without difficulty.  Pipelle passed to 9cm.  Suction applied and pipelle removed with good tissue sample obtained.  Second pass was performed as well due to clots noted today.  Tenculum removed.  No bleeding noted.  Patient tolerated procedure well.  Chaperone was present for exam.  Assessment: Enlarged, firm fibroid uterus Menorrhagia Anemia with hb 7.3 done via fingerstick in office Likely iron deficiency   Plan: Endometrial biopsy obtained today Pt will continue her iron Pap obtained Iron level added to blood work already done today--CBC and ferritin OCPs will be change to ortho novum 1/35.  She will start tonight and take second pill in the AM.  Asked pt to give update tomorrow esp if bleeding has not improved. D/w pt treatment options including [place] and hysterectomy.  Do not feel she is a candidate for endometrial ablation or myomectomy.  At this point, she is ready to proceed with treatment.   Due to firmness of uterus, may consider MRI as well prior to any surgery. Has AEX on Monday.  Will cancel this and change appt to Tuesday with PUS and follow-up with Dr. Talbert Nan. Transfusion discussed.  She is not orthostatic or tachycardic.  I feel pt can  be monitored closely for now.  We discussed transfusion.  She knows this may be needed depending on response to change in pills and restarting them.    ~30 minutes spent with patient >50% of time was in face to face discussion of above.

## 2018-06-27 ENCOUNTER — Other Ambulatory Visit: Payer: Self-pay | Admitting: Obstetrics and Gynecology

## 2018-06-27 ENCOUNTER — Other Ambulatory Visit: Payer: Self-pay | Admitting: *Deleted

## 2018-06-27 ENCOUNTER — Telehealth: Payer: Self-pay | Admitting: Obstetrics and Gynecology

## 2018-06-27 DIAGNOSIS — D500 Iron deficiency anemia secondary to blood loss (chronic): Secondary | ICD-10-CM

## 2018-06-27 DIAGNOSIS — N920 Excessive and frequent menstruation with regular cycle: Secondary | ICD-10-CM

## 2018-06-27 DIAGNOSIS — Z862 Personal history of diseases of the blood and blood-forming organs and certain disorders involving the immune mechanism: Secondary | ICD-10-CM

## 2018-06-27 DIAGNOSIS — D259 Leiomyoma of uterus, unspecified: Secondary | ICD-10-CM

## 2018-06-27 LAB — COMPREHENSIVE METABOLIC PANEL
A/G RATIO: 1.4 (ref 1.2–2.2)
ALT: 13 IU/L (ref 0–32)
AST: 15 IU/L (ref 0–40)
Albumin: 3.9 g/dL (ref 3.5–5.5)
Alkaline Phosphatase: 42 IU/L (ref 39–117)
BUN/Creatinine Ratio: 16 (ref 9–23)
BUN: 14 mg/dL (ref 6–24)
Bilirubin Total: <0.2 mg/dL (ref 0.0–1.2)
CALCIUM: 9.5 mg/dL (ref 8.7–10.2)
CO2: 25 mmol/L (ref 20–29)
Chloride: 101 mmol/L (ref 96–106)
Creatinine, Ser: 0.88 mg/dL (ref 0.57–1.00)
GFR calc Af Amer: 91 mL/min/{1.73_m2} (ref >59)
GFR, EST NON AFRICAN AMERICAN: 79 mL/min/{1.73_m2} (ref >59)
GLUCOSE: 85 mg/dL (ref 65–99)
Globulin, Total: 2.8 g/dL (ref 1.5–4.5)
POTASSIUM: 4 mmol/L (ref 3.5–5.2)
Sodium: 140 mmol/L (ref 134–144)
Total Protein: 6.7 g/dL (ref 6.0–8.5)

## 2018-06-27 LAB — CBC
Hematocrit: 22.9 % — ABNORMAL LOW (ref 34.0–46.6)
Hemoglobin: 7.4 g/dL — ABNORMAL LOW (ref 11.1–15.9)
MCH: 27.3 pg (ref 26.6–33.0)
MCHC: 32.3 g/dL (ref 31.5–35.7)
MCV: 85 fL (ref 79–97)
PLATELETS: 303 10*3/uL (ref 150–450)
RBC: 2.71 x10E6/uL — CL (ref 3.77–5.28)
RDW: 15.9 % — AB (ref 12.3–15.4)
WBC: 7.1 10*3/uL (ref 3.4–10.8)

## 2018-06-27 LAB — FERRITIN: Ferritin: 15 ng/mL (ref 15–150)

## 2018-06-27 NOTE — Telephone Encounter (Signed)
Call placed to convey benefits for ultrasound. Unable to leave a message mailbox is full.

## 2018-06-28 LAB — SPECIMEN STATUS REPORT

## 2018-06-28 LAB — IRON: Iron: 30 ug/dL (ref 27–159)

## 2018-06-30 ENCOUNTER — Telehealth: Payer: Self-pay

## 2018-06-30 ENCOUNTER — Ambulatory Visit: Payer: BC Managed Care – PPO | Admitting: Obstetrics and Gynecology

## 2018-06-30 LAB — CYTOLOGY - PAP: Diagnosis: NEGATIVE

## 2018-06-30 NOTE — Telephone Encounter (Signed)
Informed patient of results and recommendations. 

## 2018-06-30 NOTE — Telephone Encounter (Signed)
-----   Message from Salvadore Dom, MD sent at 06/29/2018  1:57 PM EDT ----- Iron level is low normal. She should continue with her current iron regimen and the iron transfusion that is being scheduled.

## 2018-07-01 ENCOUNTER — Other Ambulatory Visit: Payer: Self-pay | Admitting: Obstetrics and Gynecology

## 2018-07-01 ENCOUNTER — Ambulatory Visit (INDEPENDENT_AMBULATORY_CARE_PROVIDER_SITE_OTHER): Payer: BC Managed Care – PPO

## 2018-07-01 ENCOUNTER — Encounter: Payer: Self-pay | Admitting: Obstetrics and Gynecology

## 2018-07-01 ENCOUNTER — Other Ambulatory Visit: Payer: Self-pay

## 2018-07-01 ENCOUNTER — Ambulatory Visit: Payer: BC Managed Care – PPO | Admitting: Obstetrics and Gynecology

## 2018-07-01 VITALS — BP 112/68 | HR 78 | Ht 64.0 in | Wt 150.0 lb

## 2018-07-01 DIAGNOSIS — Z862 Personal history of diseases of the blood and blood-forming organs and certain disorders involving the immune mechanism: Secondary | ICD-10-CM | POA: Diagnosis not present

## 2018-07-01 DIAGNOSIS — N923 Ovulation bleeding: Secondary | ICD-10-CM | POA: Diagnosis not present

## 2018-07-01 DIAGNOSIS — D259 Leiomyoma of uterus, unspecified: Secondary | ICD-10-CM

## 2018-07-01 DIAGNOSIS — N920 Excessive and frequent menstruation with regular cycle: Secondary | ICD-10-CM

## 2018-07-01 DIAGNOSIS — D500 Iron deficiency anemia secondary to blood loss (chronic): Secondary | ICD-10-CM | POA: Diagnosis not present

## 2018-07-01 NOTE — Progress Notes (Signed)
GYNECOLOGY  VISIT   HPI: 47 y.o.   Single Black or African American Not Hispanic or Latino  female   [phone] with Patient's last menstrual period was 06/20/2018 (exact date).   here for f/u menorrhagia. The patient has been on OCP's to try and manage the menorrhagia from her fibroid uterus. At the time of her annual exam in 1/19 she reported normal. Her hgb at that time was 10.5 and her ferritin was 6. She started taking iron consistently and by 4/19 her ferritin was 37 ad her hgb was 12.7.  Over the last several months her cycles have gotten heavier. She was seen last week by Dr Sabra Heck with c/o a very heavy cycle, better at the time of the visit. Endometrial biopsy was benign, Hgb was 7.4, ferritin Starting around April of 2019 she had some bleeding between her cycle. Then she was bleeding monthly x 7 days, 4 heavy. She was saturating an ultra tampon in 2 hours. She bleed again in between her cycles in September, using 1 pad day. Then her 9/13 cycle started, she was bleeding very heavily. She was saturating an ultra tampon in an hour, very heavy x 6 days. Started slowing down on 9/19, but still spotting. Feeling tired, not lightheaded or dizzy.  She is averaging 2 iron tablets a day, trying to do 3 and does a multivitamin.  No baseline pain, no dyspareunia, no dysmenorrhea. On average she has nocturia x 3. Not c/o frequent urination during the day.  She is engaged.   GYNECOLOGIC HISTORY: Patient's last menstrual period was 06/20/2018 (exact date). Contraception:OCP Menopausal hormone therapy: none        OB History    Gravida  4   Para  2   Term  2   Preterm      AB  2   Living  2     SAB      TAB      Ectopic      Multiple      Live Births  2              Patient Active Problem List   Diagnosis Date Noted  . Fibroid uterus   . BMI 26.0-26.9,adult 03/28/2016  . Dyspnea on exertion 01/08/2016  . Newly recognized heart murmur 01/08/2016  . Esophageal reflux  01/08/2016    Past Medical History:  Diagnosis Date  . Abnormal EKG    December, 2013  . Anemia   . Chest pain    December, 2013  . Dyspnea   . Fibroid uterus   . GERD (gastroesophageal reflux disease)     Past Surgical History:  Procedure Laterality Date  . NO PAST SURGERIES      Current Outpatient Medications  Medication Sig Dispense Refill  . amoxicillin (AMOXIL) 500 MG capsule Take 500 mg by mouth 2 (two) times daily.    . ferrous sulfate 325 (65 FE) MG EC tablet Take 325 mg by mouth 3 (three) times daily with meals.    Marland Kitchen ibuprofen (ADVIL,MOTRIN) 800 MG tablet Take 1 tablet by mouth daily as needed.  0  . JUNEL 1.5/30 1.5-30 MG-MCG tablet TAKE 1 TABLET BY MOUTH EVERY DAY 1 Package 0  . Multiple Vitamin (MULTIVITAMIN) tablet Take 1 tablet by mouth daily.    . norethindrone-ethinyl estradiol 1/35 (ORTHO-NOVUM, NORTREL,CYCLAFEM) tablet Take 1 tablet by mouth daily. 1 Package 3   Current Facility-Administered Medications  Medication Dose Route Frequency Provider Last Rate Last Dose  .  0.9 %  sodium chloride infusion  500 mL Intravenous Continuous Milus Banister, MD         ALLERGIES: Patient has no known allergies.  Family History  Problem Relation Age of Onset  . Cirrhosis Mother   . Diabetes Mother   . Stroke Mother 32  . Osteoporosis Mother   . Heart disease Maternal Grandmother   . Colon cancer Father   . Diabetes Father   . Hypertension Father     Social History   Socioeconomic History  . Marital status: Single    Spouse name: n/a  . Number of children: 0  . Years of education: Master's  . Highest education level: Not on file  Occupational History  . Occupation: Patent attorney    Comment: [employer]  . Financial resource strain: Not on file  . Food insecurity:    Worry: Not on file    Inability: Not on file  . Transportation needs:    Medical: Not on file    Non-medical: Not on file  Tobacco Use  . Smoking status: Never  Smoker  . Smokeless tobacco: Never Used  Substance and Sexual Activity  . Alcohol use: No    Alcohol/week: 0.0 standard drinks  . Drug use: No  . Sexual activity: Yes    Partners: Male    Birth control/protection: Condom  Lifestyle  . Physical activity:    Days per week: Not on file    Minutes per session: Not on file  . Stress: Not on file  Relationships  . Social connections:    Talks on phone: Not on file    Gets together: Not on file    Attends religious service: Not on file    Active member of club or organization: Not on file    Attends meetings of clubs or organizations: Not on file    Relationship status: Not on file  . Intimate partner violence:    Fear of current or ex partner: Not on file    Emotionally abused: Not on file    Physically abused: Not on file    Forced sexual activity: Not on file  Other Topics Concern  . Not on file  Social History Narrative   Lives with her oldest daughter and granddaughter.    Review of Systems  Constitutional: Negative.   HENT: Negative.   Eyes: Negative.   Respiratory: Negative.   Cardiovascular: Negative.   Gastrointestinal: Negative.   Endocrine: Negative.   Genitourinary:       Irregular cycle  Musculoskeletal: Negative.   Skin: Negative.   Allergic/Immunologic: Negative.   Neurological: Negative.   Hematological: Negative.   Psychiatric/Behavioral: Negative.   All other systems reviewed and are negative.   PHYSICAL EXAMINATION:    BP 112/68   Pulse 78   Ht 5\' 4"  (1.626 m)   Wt 150 lb (68 kg)   LMP 06/20/2018 (Exact Date)   BMI 25.75 kg/m     General appearance: alert, cooperative and appears stated age Abdomen: soft, non-tender; non distended, uterus firm, mobile, not tender, palpated up to the level of the umbilicus.  Pelvic: External genitalia:  no lesions              Urethra:  normal appearing urethra with no masses, tenderness or lesions              Bartholins and Skenes: normal  Vagina: normal appearing vagina with normal color and discharge, no lesions              Cervix: no lesions              Bimanual Exam:  Uterus:  irregular, decreased mobility, firm, ~18-20 week sized. Can palpate uterus up to her umbilicus.              Adnexa: no mass, fullness, tenderness                Chaperone was present for exam.  ASSESSMENT Symptomatic fibroid uterus with severe menorrhagia leading to anemia. No longer controlled with OCP's.  Normal endometrial biopsy and pap from last week. U/S shows enlarging fibroid uterus. On exam 18-20 week sized, up from 12 week sized last year    PLAN Consult with hematology for iron transfusion  We discussed options of uterine artery embolization and hysterectomy, she prefers hysterectomy Recommend lupron for 3 months to improve anemia and hopefully shrink her fibroids some Then plan TLH/BS/cystoscopy at the end of the 3 months of the lupron (discussed in bag morcellation and removal through umbilicus or vagina)     An After Visit Summary was printed and given to the patient.  ~25 minutes face to face time of which over 50% was spent in counseling.

## 2018-07-02 ENCOUNTER — Encounter: Payer: Self-pay | Admitting: Obstetrics and Gynecology

## 2018-07-10 ENCOUNTER — Other Ambulatory Visit: Payer: Self-pay | Admitting: Hematology

## 2018-07-10 DIAGNOSIS — D500 Iron deficiency anemia secondary to blood loss (chronic): Secondary | ICD-10-CM

## 2018-07-10 DIAGNOSIS — N921 Excessive and frequent menstruation with irregular cycle: Secondary | ICD-10-CM | POA: Insufficient documentation

## 2018-07-10 NOTE — Assessment & Plan Note (Addendum)
I reviewed the patient's external documents, including gynecology clinic notes and laboratory studies. Her hemoglobin was normal in 2017, starting in the end of 2018 her hemoglobin has been slowly downtrending, most recently 7.4 in mid September 2019. In reviewing her history of menstrual bleeding, the worsening anemia corresponded to the time period during which her menorrhagia had also become more severe.  Iron profile in mid September 2019 was consistent with severe iron deficiency. Colonoscopy in 2016 was normal per pt.  Peripheral blood smear was reviewed and showed normocytic RBCs, some which were immature, consistent with increased erythropoiesis We discussed some of the risks, benefits, and alternatives of intravenous iron infusions.  The patient is symptomatic from anemia and the iron level is critically low; as such, oral supplement is not sufficient to replete iron storage quickly and she will need IV iron to higher levels of iron faster for adequate hematopoesis.  Some of the side-effects to be expected including risks of infusion reactions, phlebitis, headaches, nausea and fatigue.   The patient is willing to proceed, 1st dose tentatively scheduled on 07/17/2018.  Goal is to keep ferritin level greater than 50.

## 2018-07-10 NOTE — Assessment & Plan Note (Signed)
Patient has chronic severe menorrhagia due to uterine fibroids, for which she is followed closely by gynecology. At the most recent visit in the end of September 2019, uterine artery embolization and hysterectomy were discussed. Patient expressed preference for hysterectomy; she had been started on Lupron to shrink the size of the fibroid, with the plan for TLH/BS/cystoscopy at the end of the 67-month treatment with Lupron. I encouraged patient to continue close follow-up with gynecology, as her iron deficiency anemia will persist until the source of the bleeding is addressed.

## 2018-07-11 ENCOUNTER — Encounter: Payer: Self-pay | Admitting: Hematology

## 2018-07-11 ENCOUNTER — Other Ambulatory Visit: Payer: Self-pay | Admitting: Hematology

## 2018-07-11 ENCOUNTER — Inpatient Hospital Stay: Payer: BC Managed Care – PPO | Attending: Hematology

## 2018-07-11 ENCOUNTER — Inpatient Hospital Stay (HOSPITAL_BASED_OUTPATIENT_CLINIC_OR_DEPARTMENT_OTHER): Payer: BC Managed Care – PPO | Admitting: Hematology

## 2018-07-11 ENCOUNTER — Ambulatory Visit: Payer: BC Managed Care – PPO | Admitting: Hematology

## 2018-07-11 ENCOUNTER — Inpatient Hospital Stay: Payer: BC Managed Care – PPO

## 2018-07-11 ENCOUNTER — Other Ambulatory Visit: Payer: Self-pay

## 2018-07-11 VITALS — BP 119/71 | HR 83 | Temp 98.8°F | Resp 16 | Wt 150.0 lb

## 2018-07-11 DIAGNOSIS — N921 Excessive and frequent menstruation with irregular cycle: Secondary | ICD-10-CM | POA: Insufficient documentation

## 2018-07-11 DIAGNOSIS — D500 Iron deficiency anemia secondary to blood loss (chronic): Secondary | ICD-10-CM | POA: Diagnosis present

## 2018-07-11 LAB — CBC WITH DIFFERENTIAL (CANCER CENTER ONLY)
BASOS PCT: 0 %
Basophils Absolute: 0 10*3/uL (ref 0.0–0.1)
EOS ABS: 0.1 10*3/uL (ref 0.0–0.5)
Eosinophils Relative: 1 %
HCT: 31.5 % — ABNORMAL LOW (ref 34.8–46.6)
Hemoglobin: 9.8 g/dL — ABNORMAL LOW (ref 11.6–15.9)
Lymphocytes Relative: 16 %
Lymphs Abs: 1.2 10*3/uL (ref 0.9–3.3)
MCH: 28.8 pg (ref 26.0–34.0)
MCHC: 31.1 g/dL — AB (ref 32.0–36.0)
MCV: 92.6 fL (ref 81.0–101.0)
MONOS PCT: 6 %
Monocytes Absolute: 0.5 10*3/uL (ref 0.1–0.9)
Neutro Abs: 5.8 10*3/uL (ref 1.5–6.5)
Neutrophils Relative %: 77 %
Platelet Count: 309 10*3/uL (ref 145–400)
RBC: 3.4 MIL/uL — ABNORMAL LOW (ref 3.70–5.32)
RDW: 17.8 % — ABNORMAL HIGH (ref 11.1–15.7)
WBC Count: 7.6 10*3/uL (ref 3.9–10.0)

## 2018-07-11 LAB — FOLATE: FOLATE: 15.2 ng/mL (ref >5.9)

## 2018-07-11 LAB — SAVE SMEAR

## 2018-07-11 LAB — VITAMIN B12: Vitamin B-12: 266 pg/mL (ref 180–914)

## 2018-07-11 NOTE — Progress Notes (Signed)
[facility] NOTE  Patient Care Team: Wendie Agreste, MD as PCP - General (Family Medicine)  ASSESSMENT & PLAN:  Menorrhagia with irregular cycle Patient has chronic severe menorrhagia due to uterine fibroids, for which she is followed closely by gynecology. At the most recent visit in the end of September 2019, uterine artery embolization and hysterectomy were discussed. Patient expressed preference for hysterectomy; she had been started on Lupron to shrink the size of the fibroid, with the plan for TLH/BS/cystoscopy at the end of the 35-month treatment with Lupron. I encouraged patient to continue close follow-up with gynecology, as her iron deficiency anemia will persist until the source of the bleeding is addressed.   Iron deficiency anemia due to chronic blood loss I reviewed the patient's external documents, including gynecology clinic notes and laboratory studies. Her hemoglobin was normal in 2017, starting in the end of 2018 her hemoglobin has been slowly downtrending, most recently 7.4 in mid September 2019. In reviewing her history of menstrual bleeding, the worsening anemia corresponded to the time period during which her menorrhagia had also become more severe.  Iron profile in mid September 2019 was consistent with severe iron deficiency. Colonoscopy in 2016 was normal per pt.  Peripheral blood smear was reviewed and showed normocytic RBCs, some which were immature, consistent with increased erythropoiesis We discussed some of the risks, benefits, and alternatives of intravenous iron infusions.  The patient is symptomatic from anemia and the iron level is critically low; as such, oral supplement is not sufficient to replete iron storage quickly and she will need IV iron to higher levels of iron faster for adequate hematopoesis.  Some of the side-effects to be expected including risks of infusion reactions, phlebitis, headaches, nausea and fatigue.   The  patient is willing to proceed, 1st dose tentatively scheduled on 07/17/2018.  Goal is to keep ferritin level greater than 50.   Orders Placed This Encounter  Procedures  . CBC with Differential (Cancer Center Only)    Standing Status:   Future    Standing Expiration Date:   08/15/2019  . CMP ([facility] only)    Standing Status:   Future    Standing Expiration Date:   08/15/2019  . Ferritin    Standing Status:   Future    Standing Expiration Date:   08/15/2019  . Iron and TIBC    Standing Status:   Future    Standing Expiration Date:   08/15/2019   All questions were answered. The patient knows to call the clinic with any problems, questions or concerns.  Return to clinic in 2 months for labs (including iron profile) and follow-up.   [name], MD 07/11/2018 4:32 PM   CHIEF COMPLAINTS/PURPOSE OF CONSULTATION:  "I am here for anemia"  HISTORY OF PRESENTING ILLNESS:  Cathy Hernandez 47 y.o. female is here because of iron deficiency anemia secondary to menorrhagia.   Patient reports that she has had long-standing heavy menstrual periods secondary to uterine fibroids, for which she has tried various OCP's with modest improvement.  Her last menstrual cycle lasted nearly 4 weeks, and her hemoglobin was noted to be as low as 7.4 in mid-September 2019.  She reports moderate fatigue, limiting her to walking just a few flights of steps and 20 to 30 feet before she has to stop to rest.  She takes iron tablets twice a day.  She denies any fever, chills, weight loss, night sweats, epistaxis, hematochezia, melena, or hematuria.  Of  note, patient had a colonoscopy in 2016 that was reportedly normal.  MEDICAL HISTORY:  Past Medical History:  Diagnosis Date  . Abnormal EKG    December, 2013  . Anemia   . Chest pain    December, 2013  . Dyspnea   . Fibroid uterus   . GERD (gastroesophageal reflux disease)     SURGICAL HISTORY: Past Surgical History:  Procedure Laterality Date  . NO  PAST SURGERIES      SOCIAL HISTORY: Social History   Socioeconomic History  . Marital status: Single    Spouse name: n/a  . Number of children: 0  . Years of education: Master's  . Highest education level: Not on file  Occupational History  . Occupation: Patent attorney    Comment: [employer]  . Financial resource strain: Not on file  . Food insecurity:    Worry: Not on file    Inability: Not on file  . Transportation needs:    Medical: Not on file    Non-medical: Not on file  Tobacco Use  . Smoking status: Never Smoker  . Smokeless tobacco: Never Used  Substance and Sexual Activity  . Alcohol use: No    Alcohol/week: 0.0 standard drinks  . Drug use: No  . Sexual activity: Yes    Partners: Male    Birth control/protection: Condom  Lifestyle  . Physical activity:    Days per week: Not on file    Minutes per session: Not on file  . Stress: Not on file  Relationships  . Social connections:    Talks on phone: Not on file    Gets together: Not on file    Attends religious service: Not on file    Active member of club or organization: Not on file    Attends meetings of clubs or organizations: Not on file    Relationship status: Not on file  . Intimate partner violence:    Fear of current or ex partner: Not on file    Emotionally abused: Not on file    Physically abused: Not on file    Forced sexual activity: Not on file  Other Topics Concern  . Not on file  Social History Narrative   Lives with her oldest daughter and granddaughter.    FAMILY HISTORY: Family History  Problem Relation Age of Onset  . Cirrhosis Mother   . Diabetes Mother   . Stroke Mother 22  . Osteoporosis Mother   . Heart disease Maternal Grandmother   . Colon cancer Father   . Diabetes Father   . Hypertension Father     ALLERGIES:  has No Known Allergies.  MEDICATIONS:  Current Outpatient Medications  Medication Sig Dispense Refill  . amoxicillin (AMOXIL) 500 MG  capsule Take 500 mg by mouth 2 (two) times daily.    . ferrous sulfate 325 (65 FE) MG EC tablet Take 325 mg by mouth 3 (three) times daily with meals.    Marland Kitchen ibuprofen (ADVIL,MOTRIN) 800 MG tablet Take 1 tablet by mouth daily as needed.  0  . JUNEL 1.5/30 1.5-30 MG-MCG tablet TAKE 1 TABLET BY MOUTH EVERY DAY 1 Package 0  . Multiple Vitamin (MULTIVITAMIN) tablet Take 1 tablet by mouth daily.    . norethindrone-ethinyl estradiol 1/35 (ORTHO-NOVUM, NORTREL,CYCLAFEM) tablet Take 1 tablet by mouth daily. 1 Package 3   Current Facility-Administered Medications  Medication Dose Route Frequency Provider Last Rate Last Dose  . 0.9 %  sodium chloride infusion  500 mL Intravenous Continuous Milus Banister, MD        REVIEW OF SYSTEMS:   Constitutional: ( - ) fevers, ( - )  chills , ( - ) night sweats Eyes: ( - ) blurriness of vision, ( - ) double vision, ( - ) watery eyes Ears, nose, mouth, throat, and face: ( - ) mucositis, ( - ) sore throat Respiratory: ( - ) cough, ( - ) dyspnea, ( - ) wheezes Cardiovascular: ( - ) palpitation, ( - ) chest discomfort, ( - ) lower extremity swelling Gastrointestinal:  ( - ) nausea, ( - ) heartburn, ( - ) change in bowel habits Skin: ( - ) abnormal skin rashes Lymphatics: ( - ) new lymphadenopathy, ( - ) easy bruising Neurological: ( - ) numbness, ( - ) tingling, ( - ) new weaknesses Behavioral/Psych: ( - ) mood change, ( - ) new changes  All other systems were reviewed with the patient and are negative.  PHYSICAL EXAMINATION: ECOG PERFORMANCE STATUS: 1 - Symptomatic but completely ambulatory  Vitals:   07/11/18 1541  BP: 119/71  Pulse: 83  Resp: 16  Temp: 98.8 F (37.1 C)  SpO2: 100%   Filed Weights   07/11/18 1541  Weight: 150 lb (68 kg)    GENERAL: alert, no distress and comfortable SKIN: skin color, texture, turgor are normal, no rashes or significant lesions EYES: conjunctiva are pink and non-injected, sclera clear OROPHARYNX: no exudate, no  erythema; lips, buccal mucosa, and tongue normal  NECK: supple, non-tender LYMPH:  no palpable lymphadenopathy in the cervical or axillary LUNGS: clear to auscultation and percussion with normal breathing effort HEART: regular rate & rhythm and no murmurs and no lower extremity edema ABDOMEN: soft, non-tender, non-distended, normal bowel sounds Musculoskeletal: no cyanosis of digits and no clubbing  PSYCH: alert & oriented x 3, fluent speech NEURO: no focal motor/sensory deficits  LABORATORY DATA:  I have reviewed the data as listed Lab Results  Component Value Date   WBC 7.6 07/11/2018   HGB 9.8 (L) 07/11/2018   HCT 31.5 (L) 07/11/2018   MCV 92.6 07/11/2018   PLT 309 07/11/2018   Lab Results  Component Value Date   NA 140 06/26/2018   K 4.0 06/26/2018   CL 101 06/26/2018   CO2 25 06/26/2018   I personally reviewed the patient's peripheral blood smear today.  There was no peripheral blast.  The white blood cells were of normal morphology.  The red blood cells were of normal size, some of which appeared mildly hypochromic. There were also scattered immature RBC's, consistent with increased erythropoiesis.  There was no schistocytosis.  The platelets are of normal size and I have verified that there were no platelet clumping.  RADIOGRAPHIC STUDIES: I have personally reviewed the radiological images as listed and agreed with the findings in the report. US Pelvis Complete  Result Date: 07/01/2018 SEE PROGRESS NOTES FOR RESULTS

## 2018-07-14 ENCOUNTER — Telehealth: Payer: Self-pay | Admitting: Emergency Medicine

## 2018-07-14 LAB — IRON AND TIBC
Iron: 33 ug/dL — ABNORMAL LOW (ref 41–142)
Saturation Ratios: 6 % — ABNORMAL LOW (ref 21–57)
TIBC: 579 ug/dL — ABNORMAL HIGH (ref 236–444)
UIBC: 546 ug/dL

## 2018-07-14 LAB — FERRITIN: Ferritin: 20 ng/mL (ref 11–307)

## 2018-07-14 MED ORDER — LEUPROLIDE ACETATE (3 MONTH) 11.25 MG IM KIT: 11.2500 mg | PACK | INTRAMUSCULAR | 0 refills | Status: DC

## 2018-07-14 NOTE — Telephone Encounter (Signed)
Received authorization for Lupron 11.25 mg Injection  From [facility].   Authorization through 07/14/2018-10/14/2018.   [facility]axed Rx to [facility] at [phone] at this time.

## 2018-07-14 NOTE — Telephone Encounter (Signed)
Prior authorization submitted via covermymeds for Lupron 3 month therapy.  Awaiting response.

## 2018-07-15 ENCOUNTER — Ambulatory Visit: Payer: BC Managed Care – PPO | Admitting: Hematology

## 2018-07-15 ENCOUNTER — Other Ambulatory Visit: Payer: BC Managed Care – PPO

## 2018-07-17 ENCOUNTER — Inpatient Hospital Stay: Payer: BC Managed Care – PPO

## 2018-07-17 VITALS — BP 127/66 | HR 73 | Temp 98.6°F | Resp 17

## 2018-07-17 DIAGNOSIS — D500 Iron deficiency anemia secondary to blood loss (chronic): Secondary | ICD-10-CM

## 2018-07-17 DIAGNOSIS — N921 Excessive and frequent menstruation with irregular cycle: Secondary | ICD-10-CM

## 2018-07-17 MED ORDER — SODIUM CHLORIDE 0.9 % IV SOLN
Freq: Once | INTRAVENOUS | Status: AC
  Administered 2018-07-17: 16:00:00 via INTRAVENOUS
  Filled 2018-07-17: qty 250

## 2018-07-17 MED ORDER — SODIUM CHLORIDE 0.9 % IV SOLN
510.0000 mg | Freq: Once | INTRAVENOUS | Status: AC
  Administered 2018-07-17: 510 mg via INTRAVENOUS
  Filled 2018-07-17: qty 17

## 2018-07-17 NOTE — Patient Instructions (Signed)

## 2018-07-22 ENCOUNTER — Telehealth: Payer: Self-pay | Admitting: Obstetrics and Gynecology

## 2018-07-22 ENCOUNTER — Telehealth: Payer: Self-pay

## 2018-07-22 NOTE — Telephone Encounter (Signed)
Spoke with patient and reminded her its time to schedule her mammogram.  [facility] [address] [place], [place] [address] [phone]  Info given to patient.

## 2018-07-22 NOTE — Telephone Encounter (Signed)
Spoke with patient regarding benefit for surgery. Patient understood and agreeable. Patient is aware this is the benefit information for 2019. Patient has been provided the phone number for the [facility]. Patient aware this is professional benefit only. Patient aware will be contacted by hospital for separate benefits. Patient will call back to confirm.   cc: Lamont Snowball, RN

## 2018-07-24 ENCOUNTER — Inpatient Hospital Stay: Payer: BC Managed Care – PPO

## 2018-07-24 VITALS — BP 130/74 | HR 91 | Temp 98.1°F | Resp 16

## 2018-07-24 DIAGNOSIS — D500 Iron deficiency anemia secondary to blood loss (chronic): Secondary | ICD-10-CM

## 2018-07-24 DIAGNOSIS — N921 Excessive and frequent menstruation with irregular cycle: Secondary | ICD-10-CM

## 2018-07-24 MED ORDER — SODIUM CHLORIDE 0.9 % IV SOLN
Freq: Once | INTRAVENOUS | Status: AC
  Administered 2018-07-24: 14:00:00 via INTRAVENOUS
  Filled 2018-07-24: qty 250

## 2018-07-24 MED ORDER — SODIUM CHLORIDE 0.9 % IV SOLN
510.0000 mg | Freq: Once | INTRAVENOUS | Status: AC
  Administered 2018-07-24: 510 mg via INTRAVENOUS
  Filled 2018-07-24: qty 17

## 2018-07-24 NOTE — Patient Instructions (Signed)

## 2018-07-31 NOTE — Telephone Encounter (Signed)
Patient calling with questions regarding surgery. Patient was going to get medication to stop cycle prior to surgery. Patient stated that it was going to be $1500 and is asking if it was necessary for surgery.

## 2018-07-31 NOTE — Telephone Encounter (Signed)
Routing to Dr. Talbert Nan to review in regards to necessity of Lupron per patient request. .  Pt has been seen for one iron transfusion and has follow up appointment 09/09/18 with hematology.

## 2018-08-04 NOTE — Telephone Encounter (Signed)
Call to patient.  She states she is taking birth control and last month cycle was "a little bit better." She reports definitely not as heavy as it has been in the previous months.    Advised Lupron is recommended but aware not covered with her pharmacy benefits.  Will check to see if Lupron can be covered in hospital for injection.

## 2018-08-11 NOTE — Telephone Encounter (Addendum)
We need to just get her scheduled. Please schedule TLH/BS/cytoscopy. Schedule for 4 hours please.

## 2018-08-18 ENCOUNTER — Telehealth: Payer: Self-pay | Admitting: *Deleted

## 2018-08-18 NOTE — Telephone Encounter (Signed)
Follow-up call to patient regarding surgery plan.  Menses just started today, not heavy at this time.  Last iron infusion on 07-24-18. Lupron was cost prohibitive for patient.  Appointment scheduled for consult. Potential plan for surgery on 09-15-18.   Patient agreeable.

## 2018-08-19 NOTE — Progress Notes (Signed)
GYNECOLOGY  VISIT   HPI: 47 y.o.   Single Black or African American Not Hispanic or Latino  female   [phone] with Patient's last menstrual period was 08/18/2018.   here to discuss surgery. The patient has a fibroid uterus with menorrhagia leading to anemia.  She was on OCP's to try and control her bleeding which helped for several months. Over the summer her cycles got heavier. In 9/19 her hgb was 7.4, ferritin was 15. Endometrial biopsy was benign, pap was normal (9/19).  Her insurance denied coverage for lupron. She has been seen by hematology and has had 2 iron transfusions. She is currently on her cycle, this cycle and the last cycle are a little lighter than they had been (on the higher low dose pill).  No dyspareunia.   GYNECOLOGIC HISTORY: Patient's last menstrual period was 08/18/2018. Contraception: Condoms Menopausal hormone therapy: None       OB History    Gravida  4   Para  2   Term  2   Preterm      AB  2   Living  2     SAB      TAB      Ectopic      Multiple      Live Births  2              Patient Active Problem List   Diagnosis Date Noted  . Menorrhagia with irregular cycle 07/10/2018  . Iron deficiency anemia due to chronic blood loss 07/10/2018  . Fibroid uterus   . BMI 26.0-26.9,adult 03/28/2016  . Dyspnea on exertion 01/08/2016  . Newly recognized heart murmur 01/08/2016  . Esophageal reflux 01/08/2016    Past Medical History:  Diagnosis Date  . Abnormal EKG    December, 2013  . Anemia   . Chest pain    December, 2013  . Dyspnea   . Fibroid uterus   . GERD (gastroesophageal reflux disease)   She was seen by Cardiology in 12/13, negative stress echo  Past Surgical History:  Procedure Laterality Date  . NO PAST SURGERIES      Current Outpatient Medications  Medication Sig Dispense Refill  . amoxicillin (AMOXIL) 500 MG capsule Take 500 mg by mouth 2 (two) times daily.    . ferrous sulfate 325 (65 FE) MG tablet Take 325 mg by  mouth daily with breakfast.    . ibuprofen (ADVIL,MOTRIN) 800 MG tablet Take 1 tablet by mouth daily as needed.  0  . Multiple Vitamin (MULTIVITAMIN) tablet Take 1 tablet by mouth daily.    . norethindrone-ethinyl estradiol 1/35 (ORTHO-NOVUM, NORTREL,CYCLAFEM) tablet Take 1 tablet by mouth daily. 1 Package 3   Current Facility-Administered Medications  Medication Dose Route Frequency Provider Last Rate Last Dose  . 0.9 %  sodium chloride infusion  500 mL Intravenous Continuous Milus Banister, MD         ALLERGIES: Patient has no known allergies.  Family History  Problem Relation Age of Onset  . Cirrhosis Mother   . Diabetes Mother   . Stroke Mother 41  . Osteoporosis Mother   . Heart disease Maternal Grandmother   . Colon cancer Father   . Diabetes Father   . Hypertension Father     Social History   Socioeconomic History  . Marital status: Single    Spouse name: n/a  . Number of children: 0  . Years of education: Master's  . Highest education level: Not on file  Occupational History  . Occupation: Patent attorney    Comment: [employer]  . Financial resource strain: Not on file  . Food insecurity:    Worry: Not on file    Inability: Not on file  . Transportation needs:    Medical: Not on file    Non-medical: Not on file  Tobacco Use  . Smoking status: Never Smoker  . Smokeless tobacco: Never Used  Substance and Sexual Activity  . Alcohol use: No    Alcohol/week: 0.0 standard drinks  . Drug use: No  . Sexual activity: Yes    Partners: Male    Birth control/protection: Condom  Lifestyle  . Physical activity:    Days per week: Not on file    Minutes per session: Not on file  . Stress: Not on file  Relationships  . Social connections:    Talks on phone: Not on file    Gets together: Not on file    Attends religious service: Not on file    Active member of club or organization: Not on file    Attends meetings of clubs or organizations:  Not on file    Relationship status: Not on file  . Intimate partner violence:    Fear of current or ex partner: Not on file    Emotionally abused: Not on file    Physically abused: Not on file    Forced sexual activity: Not on file  Other Topics Concern  . Not on file  Social History Narrative   Lives with her oldest daughter and granddaughter.    Review of Systems  Constitutional: Negative.   HENT: Negative.   Eyes: Negative.   Respiratory: Negative.   Cardiovascular: Negative.   Gastrointestinal: Negative.   Genitourinary: Negative.   Musculoskeletal: Negative.   Skin: Negative.   Neurological: Negative.   Endo/Heme/Allergies: Negative.   Psychiatric/Behavioral: Negative.   No incontinence.  PHYSICAL EXAMINATION:    BP 118/78 (BP Location: Right Arm, Patient Position: Sitting, Cuff Size: Normal)   Pulse 76   Wt 151 lb 9.6 oz (68.8 kg)   LMP 08/18/2018   BMI 26.02 kg/m     General appearance: alert, cooperative and appears stated age Heart: regular rate and rhythm Lungs: CTAB Abdomen: soft, non-tender; bowel sounds normal;uterus palpated in the lower abdomen to just under the umbilicus Extremities: normal, atraumatic, no cyanosis Skin: normal color, texture and turgor, no rashes or lesions Lymph: normal cervical supraclavicular and inguinal nodes Neurologic: grossly normal   ASSESSMENT Enlarging fibroid uterus with heavy bleeding leading to anemia. S/P iron transfusion x 2. Some improvement in bleeding on higher dose OCP's  H/O "abnormal EKG" with normal stress Echo    PLAN S/P iron transfusion x 2 CBC today Discussed total laparoscopic hysterectomy, bilateral salpingectomy and cystoscopy. Discussed contained uterine morcellation in a bag. Reviewed the risks of the procedure, including infection, bleeding, damage to bowel/badder/vessels/ureters.  Discussed the possible need for laparotomy. Discussed post operative recovery and risk of cuff dehiscence. All of her  questions were answered Will get her in for medical clearance    An After Visit Summary was printed and given to the patient.  ~20 minutes face to face time of which over 50% was spent in counseling.

## 2018-08-20 ENCOUNTER — Other Ambulatory Visit: Payer: Self-pay

## 2018-08-20 ENCOUNTER — Encounter: Payer: Self-pay | Admitting: Obstetrics and Gynecology

## 2018-08-20 ENCOUNTER — Ambulatory Visit: Payer: BC Managed Care – PPO | Admitting: Obstetrics and Gynecology

## 2018-08-20 VITALS — BP 118/78 | HR 76 | Wt 151.6 lb

## 2018-08-20 DIAGNOSIS — D259 Leiomyoma of uterus, unspecified: Secondary | ICD-10-CM | POA: Diagnosis not present

## 2018-08-20 DIAGNOSIS — D500 Iron deficiency anemia secondary to blood loss (chronic): Secondary | ICD-10-CM | POA: Diagnosis not present

## 2018-08-20 DIAGNOSIS — N920 Excessive and frequent menstruation with regular cycle: Secondary | ICD-10-CM | POA: Diagnosis not present

## 2018-08-20 NOTE — Patient Instructions (Signed)
Follow up with Dr. Rogers Blocker on Friday at [place], [place] [address]  Phone [phone]  For Pre-op Clearance.

## 2018-08-21 LAB — CBC
Hematocrit: 39.4 % (ref 34.0–46.6)
Hemoglobin: 12.9 g/dL (ref 11.1–15.9)
MCH: 29.9 pg (ref 26.6–33.0)
MCHC: 32.7 g/dL (ref 31.5–35.7)
MCV: 91 fL (ref 79–97)
PLATELETS: 304 10*3/uL (ref 150–450)
RBC: 4.31 x10E6/uL (ref 3.77–5.28)
RDW: 13.2 % (ref 12.3–15.4)
WBC: 3.5 10*3/uL (ref 3.4–10.8)

## 2018-08-22 ENCOUNTER — Ambulatory Visit: Payer: BC Managed Care – PPO | Admitting: Family Medicine

## 2018-08-22 ENCOUNTER — Telehealth: Payer: Self-pay | Admitting: *Deleted

## 2018-08-22 ENCOUNTER — Encounter: Payer: Self-pay | Admitting: Family Medicine

## 2018-08-22 VITALS — BP 118/80 | HR 75 | Temp 98.7°F | Ht 64.0 in | Wt 149.4 lb

## 2018-08-22 DIAGNOSIS — Z13220 Encounter for screening for lipoid disorders: Secondary | ICD-10-CM | POA: Diagnosis not present

## 2018-08-22 DIAGNOSIS — Z01818 Encounter for other preprocedural examination: Secondary | ICD-10-CM

## 2018-08-22 DIAGNOSIS — Z800 Family history of malignant neoplasm of digestive organs: Secondary | ICD-10-CM | POA: Insufficient documentation

## 2018-08-22 DIAGNOSIS — R9431 Abnormal electrocardiogram [ECG] [EKG]: Secondary | ICD-10-CM

## 2018-08-22 DIAGNOSIS — R5383 Other fatigue: Secondary | ICD-10-CM

## 2018-08-22 LAB — LIPID PANEL
CHOLESTEROL: 158 mg/dL (ref 0–200)
HDL: 60.5 mg/dL (ref >39.00)
LDL CALC: 76 mg/dL (ref 0–99)
NonHDL: 97.36
TRIGLYCERIDES: 107 mg/dL (ref 0.0–149.0)
Total CHOL/HDL Ratio: 3
VLDL: 21.4 mg/dL (ref 0.0–40.0)

## 2018-08-22 LAB — COMPREHENSIVE METABOLIC PANEL
ALBUMIN: 4.4 g/dL (ref 3.5–5.2)
ALT: 28 U/L (ref 0–35)
AST: 15 U/L (ref 0–37)
Alkaline Phosphatase: 46 U/L (ref 39–117)
BUN: 17 mg/dL (ref 6–23)
CALCIUM: 10 mg/dL (ref 8.4–10.5)
CHLORIDE: 101 meq/L (ref 96–112)
CO2: 31 mEq/L (ref 19–32)
Creatinine, Ser: 0.83 mg/dL (ref 0.40–1.20)
GFR: 94.93 mL/min (ref >60.00)
Glucose, Bld: 83 mg/dL (ref 70–99)
Potassium: 4.4 mEq/L (ref 3.5–5.1)
Sodium: 140 mEq/L (ref 135–145)
Total Bilirubin: 0.2 mg/dL (ref 0.2–1.2)
Total Protein: 7.8 g/dL (ref 6.0–8.3)

## 2018-08-22 LAB — TSH: TSH: 0.68 u[IU]/mL (ref 0.35–4.50)

## 2018-08-22 LAB — VITAMIN D 25 HYDROXY (VIT D DEFICIENCY, FRACTURES): VITD: 24.09 ng/mL — AB (ref 30.00–100.00)

## 2018-08-22 NOTE — Progress Notes (Signed)
Patient: Cathy Hernandez MRN: [id number] DOB: [birth year] PCP: Cathy Flaming, MD     Subjective:  Chief Complaint  Patient presents with  . Establish Care  . surgical clearance    HPI: [name] is a 47 year old female here to establish care as well as get medical clearance for upcoming surgery. She has medical history significant for GERD, uterine fibroids, iron deficiency anemia due to chronic blood loss and menorrhagia. She was told she had a new murmur in 2017, but had normal echo in 2017. She is scheduled to have a total hysterectomy pending clearance.  She denies any chest pain, shortness of breath, cough or leg swelling. She has no family history of cardiac disease and she has no history of smoking, diabetes, hyperlipidemia or HTN.    Immunization History  Administered Date(s) Administered  . PPD Test 06/10/2015  . Tdap 06/10/2015    Flu: declines.  Cscope: utd. (father died of colon cancer) Mmg: done.  Pap: utd.    Review of Systems  Constitutional: Negative for chills, fatigue and fever.  HENT: Negative for dental problem, ear pain, hearing loss and trouble swallowing.   Eyes: Negative for visual disturbance.  Respiratory: Negative for cough, chest tightness and shortness of breath.   Cardiovascular: Negative for chest pain, palpitations and leg swelling.  Gastrointestinal: Negative for abdominal pain, blood in stool, diarrhea and nausea.  Endocrine: Negative for cold intolerance, polydipsia, polyphagia and polyuria.  Genitourinary: Negative for dysuria and hematuria.  Musculoskeletal: Negative for arthralgias, back pain and neck pain.  Skin: Negative.  Negative for rash.  Neurological: Negative for dizziness and headaches.  Psychiatric/Behavioral: Negative for dysphoric mood and sleep disturbance. The patient is not nervous/anxious.     Allergies Patient has No Known Allergies.  Past Medical History Patient  has a past medical history of Abnormal EKG,  Anemia, Chest pain, Dyspnea, Fibroid uterus, and GERD (gastroesophageal reflux disease).  Surgical History Patient  has a past surgical history that includes No past surgeries.  Family History Pateint's family history includes Cirrhosis in her mother; Colon cancer in her father; Diabetes in her father and mother; Heart disease in her maternal grandmother; Hypertension in her father; Osteoporosis in her mother; Stroke (age of onset: 23) in her mother.  Social History Patient  reports that she has never smoked. She has never used smokeless tobacco. She reports that she does not drink alcohol or use drugs.    Objective: Vitals:   08/22/18 0952  BP: 118/80  Pulse: 75  Temp: 98.7 F (37.1 C)  TempSrc: Oral  SpO2: 98%  Weight: 149 lb 6.4 oz (67.8 kg)  Height: 5\' 4"  (1.626 m)    Body mass index is 25.64 kg/m.  Physical Exam  Constitutional: She is oriented to person, place, and time. She appears well-developed and well-nourished.  HENT:  Right Ear: External ear normal.  Left Ear: External ear normal.  Mouth/Throat: Oropharynx is clear and moist.  Eyes: Pupils are equal, round, and reactive to light. Conjunctivae and EOM are normal.  Neck: Normal range of motion. Neck supple. No thyromegaly present.  Cardiovascular: Normal rate, regular rhythm, normal heart sounds and intact distal pulses.  No murmur heard. Pulmonary/Chest: Effort normal and breath sounds normal.  Abdominal: Soft. Bowel sounds are normal. She exhibits no distension. There is no tenderness.  Lymphadenopathy:    She has no cervical adenopathy.  Neurological: She is alert and oriented to person, place, and time. She displays normal reflexes. No cranial nerve deficit. Coordination  normal.  Skin: Skin is warm and dry. No rash noted.  Psychiatric: She has a normal mood and affect. Her behavior is normal.  Vitals reviewed.  Depression screen [facility] 2/9 08/22/2018 03/28/2016 01/08/2016 06/10/2015  Decreased Interest 0 0 0 0   Down, Depressed, Hopeless 0 0 0 0  PHQ - 2 Score 0 0 0 0   Ekg: NSR with rate of 71. Some t wave inversion. Similar to ekg in 2017.     Assessment/plan: 1. Pre-op exam ekg with no pathological t waves and similar to ekg in 2017. With major surgery, will stress her heart. She has low cardiac risks with no family history and no personal history of cardiac disease, but discussed we will do this to fully clear her.  Will do stat so we can get this done before surgery. All labs wnl.   - EKG 12-Lead - Exercise Tolerance Test; Future - Comprehensive metabolic panel  2. Abnormal EKG -not really pathological inverted T waves, but will send for stress test for clearance.ewill do stat so we can get done before her surgery.  - Exercise Tolerance Test; Future  3. Screening cholesterol level  - Lipid panel  4. Other fatigue  - TSH - VITAMIN D 25 Hydroxy (Vit-D Deficiency, Fractures)    Return if symptoms worsen or fail to improve.   Cathy Flaming, MD [place]  08/22/2018

## 2018-08-22 NOTE — Telephone Encounter (Signed)
Call to patient.  Advised surgery scheduled for 09-22-18 at [facility].  Surgery instruction sheet reviewed and printed copy will be mailed.  Advised of CBC results from office visit on 08-20-18, per Dr Talbert Nan result note.  Patient had PCP appointment today and has decided top proceed with stress test for additional screening.

## 2018-08-25 ENCOUNTER — Other Ambulatory Visit: Payer: Self-pay | Admitting: Family Medicine

## 2018-08-25 MED ORDER — VITAMIN D (ERGOCALCIFEROL) 1.25 MG (50000 UNIT) PO CAPS: ORAL_CAPSULE | ORAL | 0 refills | Status: DC

## 2018-08-26 ENCOUNTER — Telehealth (HOSPITAL_COMMUNITY): Payer: Self-pay

## 2018-08-26 NOTE — Telephone Encounter (Signed)
Encounter complete. 

## 2018-08-28 ENCOUNTER — Ambulatory Visit (HOSPITAL_COMMUNITY)
Admission: RE | Admit: 2018-08-28 | Discharge: 2018-08-28 | Disposition: A | Discharge status: Home / Self Care | Payer: BC Managed Care – PPO | Source: Ambulatory Visit | Attending: Cardiovascular Disease | Admitting: Cardiovascular Disease

## 2018-08-28 DIAGNOSIS — Z01818 Encounter for other preprocedural examination: Secondary | ICD-10-CM

## 2018-08-28 DIAGNOSIS — R9431 Abnormal electrocardiogram [ECG] [EKG]: Secondary | ICD-10-CM | POA: Diagnosis not present

## 2018-08-28 LAB — EXERCISE TOLERANCE TEST
CHL CUP MPHR: 174 {beats}/min
CSEPEDS: 51 s
Estimated workload: 11.5 METS
Exercise duration (min): 9 min
Peak HR: 179 {beats}/min
Percent HR: 102 %
RPE: 17
Rest HR: 78 {beats}/min

## 2018-09-07 ENCOUNTER — Other Ambulatory Visit: Payer: Self-pay | Admitting: Family Medicine

## 2018-09-08 NOTE — Telephone Encounter (Signed)
Attempted to contact pt; unable to leave voicemail due to mailbox being full in regards to Vitamin D rx.  We have several pharmacies listed in pt's chart and need to clarify which pharmacy that rx needs to be sent to.  CRM created

## 2018-09-09 ENCOUNTER — Inpatient Hospital Stay: Payer: BC Managed Care – PPO | Admitting: Hematology

## 2018-09-09 ENCOUNTER — Inpatient Hospital Stay: Payer: BC Managed Care – PPO | Attending: Hematology

## 2018-09-10 ENCOUNTER — Telehealth: Payer: Self-pay

## 2018-09-10 NOTE — Telephone Encounter (Signed)
Attempted to contact patient a 2nd time to find out which pharmacy her Vitamin D needs to be sent to.  Several pharmacies listed in pt's chart.  Unable to leave message due to her voice mailbox being full.

## 2018-09-10 NOTE — Patient Instructions (Addendum)
Your procedure is scheduled on:  Monday, 12/16  Enter through the Main Entrance of [facility] at: 7:45 am  Pick up the phone at the desk and dial [phone].  Call this number if you have problems the morning of surgery: [phone].  Remember: Do NOT eat food or Do NOT drink clear liquids (including water) after midnight Sunday.  Take these medicines the morning of surgery with a SIP OF WATER: None  Brush your teeth on the day of surgery.  Stop herbal medications, vitamin supplements, Ibuprofen/NSAIDS at this time.  Do NOT wear jewelry (body piercing), metal hair clips/bobby pins, make-up, or nail polish. Do NOT wear lotions, powders, or perfumes.  You may wear deoderant. Do NOT shave for 48 hours prior to surgery. Do NOT bring valuables to the hospital.  Leave suitcase in car.  After surgery it may be brought to your room.  For patients admitted to the hospital, checkout time is 11:00 AM the day of discharge. Have a responsible adult drive you home and stay with you for 24 hours after your procedure.  Home with Daughter [place] cell [phone] or Daughter Lockie [place] cell [phone].

## 2018-09-11 NOTE — Telephone Encounter (Signed)
Called and spoke with patient.  She would like Vitamin D rx to be filled at [facility] [place] [address] [place].   Contacted pharmacy and gave them okay to fill rx.

## 2018-09-12 ENCOUNTER — Other Ambulatory Visit: Payer: Self-pay | Admitting: Obstetrics & Gynecology

## 2018-09-12 DIAGNOSIS — D500 Iron deficiency anemia secondary to blood loss (chronic): Secondary | ICD-10-CM

## 2018-09-12 NOTE — Telephone Encounter (Signed)
Medication refill request: Nortrel  Last AEX:  06-26-17 JJ  Next OV: 09-29-18  Last MMG (if hormonal medication request): 06-28-17 density C/BIRADS 3 probably benign  Refill authorized: 06-26-18 1pack, 3RF. Please advise.

## 2018-09-12 NOTE — H&P (Addendum)
GYNECOLOGY  VISIT   HPI: 47 y.o.   Single Black or African American Not Hispanic or Latino  female   [phone] with Patient's last menstrual period was 08/18/2018.   here to discuss surgery. The patient has a fibroid uterus with menorrhagia leading to anemia.  She was on OCP's to try and control her bleeding which helped for several months. Over the summer her cycles got heavier. In 9/19 her hgb was 7.4, ferritin was 15. Endometrial biopsy was benign, pap was normal (9/19).  Her insurance denied coverage for lupron. She has been seen by hematology and has had 2 iron transfusions. She is currently on her cycle, this cycle and the last cycle are a little lighter than they had been (on the higher low dose pill).  No dyspareunia.   GYNECOLOGIC HISTORY: Patient's last menstrual period was 08/18/2018. Contraception: Condoms Menopausal hormone therapy: None               OB History    Gravida  4   Para  2   Term  2   Preterm      AB  2   Living  2     SAB      TAB      Ectopic      Multiple      Live Births  2                  Patient Active Problem List   Diagnosis Date Noted  . Menorrhagia with irregular cycle 07/10/2018  . Iron deficiency anemia due to chronic blood loss 07/10/2018  . Fibroid uterus   . BMI 26.0-26.9,adult 03/28/2016  . Dyspnea on exertion 01/08/2016  . Newly recognized heart murmur 01/08/2016  . Esophageal reflux 01/08/2016        Past Medical History:  Diagnosis Date  . Abnormal EKG    December, 2013  . Anemia   . Chest pain    December, 2013  . Dyspnea   . Fibroid uterus   . GERD (gastroesophageal reflux disease)   She was seen by Cardiology in 12/13, negative stress echo       Past Surgical History:  Procedure Laterality Date  . NO PAST SURGERIES            Current Outpatient Medications  Medication Sig Dispense Refill  . amoxicillin (AMOXIL) 500 MG capsule Take 500 mg by mouth 2 (two) times daily.     . ferrous sulfate 325 (65 FE) MG tablet Take 325 mg by mouth daily with breakfast.    . ibuprofen (ADVIL,MOTRIN) 800 MG tablet Take 1 tablet by mouth daily as needed.  0  . Multiple Vitamin (MULTIVITAMIN) tablet Take 1 tablet by mouth daily.    . norethindrone-ethinyl estradiol 1/35 (ORTHO-NOVUM, NORTREL,CYCLAFEM) tablet Take 1 tablet by mouth daily. 1 Package 3            Current Facility-Administered Medications  Medication Dose Route Frequency Provider Last Rate Last Dose  . 0.9 %  sodium chloride infusion  500 mL Intravenous Continuous Milus Banister, MD         ALLERGIES: Patient has no known allergies.       Family History  Problem Relation Age of Onset  . Cirrhosis Mother   . Diabetes Mother   . Stroke Mother 35  . Osteoporosis Mother   . Heart disease Maternal Grandmother   . Colon cancer Father   . Diabetes Father   . Hypertension Father  Social History        Socioeconomic History  . Marital status: Single    Spouse name: n/a  . Number of children: 0  . Years of education: Master's  . Highest education level: Not on file  Occupational History  . Occupation: Patent attorney    Comment: [employer]  . Financial resource strain: Not on file  . Food insecurity:    Worry: Not on file    Inability: Not on file  . Transportation needs:    Medical: Not on file    Non-medical: Not on file  Tobacco Use  . Smoking status: Never Smoker  . Smokeless tobacco: Never Used  Substance and Sexual Activity  . Alcohol use: No    Alcohol/week: 0.0 standard drinks  . Drug use: No  . Sexual activity: Yes    Partners: Male    Birth control/protection: Condom  Lifestyle  . Physical activity:    Days per week: Not on file    Minutes per session: Not on file  . Stress: Not on file  Relationships  . Social connections:    Talks on phone: Not on file    Gets together: Not on file    Attends religious  service: Not on file    Active member of club or organization: Not on file    Attends meetings of clubs or organizations: Not on file    Relationship status: Not on file  . Intimate partner violence:    Fear of current or ex partner: Not on file    Emotionally abused: Not on file    Physically abused: Not on file    Forced sexual activity: Not on file  Other Topics Concern  . Not on file  Social History Narrative   Lives with her oldest daughter and granddaughter.    Review of Systems  Constitutional: Negative.   HENT: Negative.   Eyes: Negative.   Respiratory: Negative.   Cardiovascular: Negative.   Gastrointestinal: Negative.   Genitourinary: Negative.   Musculoskeletal: Negative.   Skin: Negative.   Neurological: Negative.   Endo/Heme/Allergies: Negative.   Psychiatric/Behavioral: Negative.   No incontinence.  PHYSICAL EXAMINATION:    BP 118/78 (BP Location: Right Arm, Patient Position: Sitting, Cuff Size: Normal)   Pulse 76   Wt 151 lb 9.6 oz (68.8 kg)   LMP 08/18/2018   BMI 26.02 kg/m     General appearance: alert, cooperative and appears stated age Heart: regular rate and rhythm Lungs: CTAB Abdomen: soft, non-tender; bowel sounds normal;uterus palpated in the lower abdomen to just under the umbilicus Extremities: normal, atraumatic, no cyanosis Skin: normal color, texture and turgor, no rashes or lesions Lymph: normal cervical supraclavicular and inguinal nodes Neurologic: grossly normal   ASSESSMENT Enlarging fibroid uterus with heavy bleeding leading to anemia. S/P iron transfusion x 2. Some improvement in bleeding on higher dose OCP's  H/O "abnormal EKG" with normal stress Echo    PLAN S/P iron transfusion x 2 CBC today Discussed total laparoscopic hysterectomy, bilateral salpingectomy and cystoscopy. Discussed contained uterine morcellation in a bag. Reviewed the risks of the procedure, including infection, bleeding, damage to  bowel/badder/vessels/ureters.  Discussed the possible need for laparotomy. Discussed post operative recovery and risk of cuff dehiscence. All of her questions were answered Will get her in for medical clearance    An After Visit Summary was printed and given to the patient.  Addendum: the patient has hada negative stress test and medical clearance

## 2018-09-15 ENCOUNTER — Encounter (HOSPITAL_COMMUNITY): Payer: Self-pay

## 2018-09-15 ENCOUNTER — Other Ambulatory Visit: Payer: Self-pay

## 2018-09-15 ENCOUNTER — Encounter (HOSPITAL_COMMUNITY)
Admission: RE | Admit: 2018-09-15 | Discharge: 2018-09-15 | Disposition: A | Discharge status: Home / Self Care | Payer: BC Managed Care – PPO | Source: Ambulatory Visit | Attending: Obstetrics and Gynecology | Admitting: Obstetrics and Gynecology

## 2018-09-15 DIAGNOSIS — Z01812 Encounter for preprocedural laboratory examination: Secondary | ICD-10-CM | POA: Insufficient documentation

## 2018-09-15 LAB — CBC
HEMATOCRIT: 40.3 % (ref 36.0–46.0)
HEMOGLOBIN: 13 g/dL (ref 12.0–15.0)
MCH: 30.2 pg (ref 26.0–34.0)
MCHC: 32.3 g/dL (ref 30.0–36.0)
MCV: 93.5 fL (ref 80.0–100.0)
Platelets: 241 10*3/uL (ref 150–400)
RBC: 4.31 MIL/uL (ref 3.87–5.11)
RDW: 13.4 % (ref 11.5–15.5)
WBC: 4.5 10*3/uL (ref 4.0–10.5)
nRBC: 0 % (ref 0.0–0.2)

## 2018-09-15 LAB — TYPE AND SCREEN
ABO/RH(D): AB POS
Antibody Screen: NEGATIVE

## 2018-09-15 LAB — ABO/RH: ABO/RH(D): AB POS

## 2018-09-15 NOTE — Pre-Procedure Instructions (Signed)
Patient was cleared for surgery by Dr. Orma Flaming.  In Epic, see OV 08/22/18 and Exercise Tolerance Test  08/2118. EKG and stress test normal per Dr. Rogers Blocker.

## 2018-09-17 ENCOUNTER — Telehealth: Payer: Self-pay

## 2018-09-17 NOTE — Telephone Encounter (Signed)
Patient in 04 recall. Due for MMG, please contact patient.

## 2018-09-18 NOTE — Telephone Encounter (Signed)
Left message to call [place] at [phone].  Bilateral diagnostic mammogram and [facility].

## 2018-09-22 ENCOUNTER — Ambulatory Visit (HOSPITAL_COMMUNITY): Payer: BC Managed Care – PPO | Admitting: Anesthesiology

## 2018-09-22 ENCOUNTER — Encounter (HOSPITAL_COMMUNITY)
Admission: AD | Disposition: A | Discharge status: Home / Self Care | Payer: Self-pay | Source: Home / Self Care | Attending: Obstetrics and Gynecology

## 2018-09-22 ENCOUNTER — Other Ambulatory Visit: Payer: Self-pay

## 2018-09-22 ENCOUNTER — Ambulatory Visit (HOSPITAL_COMMUNITY)
Admission: AD | Admit: 2018-09-22 | Discharge: 2018-09-23 | Disposition: A | Discharge status: Home / Self Care | Payer: BC Managed Care – PPO | Attending: Obstetrics and Gynecology | Admitting: Obstetrics and Gynecology

## 2018-09-22 ENCOUNTER — Encounter (HOSPITAL_COMMUNITY): Payer: Self-pay

## 2018-09-22 DIAGNOSIS — D259 Leiomyoma of uterus, unspecified: Secondary | ICD-10-CM | POA: Diagnosis present

## 2018-09-22 DIAGNOSIS — Z23 Encounter for immunization: Secondary | ICD-10-CM | POA: Insufficient documentation

## 2018-09-22 DIAGNOSIS — D500 Iron deficiency anemia secondary to blood loss (chronic): Secondary | ICD-10-CM | POA: Diagnosis not present

## 2018-09-22 DIAGNOSIS — N920 Excessive and frequent menstruation with regular cycle: Secondary | ICD-10-CM

## 2018-09-22 DIAGNOSIS — Z90710 Acquired absence of both cervix and uterus: Secondary | ICD-10-CM | POA: Diagnosis present

## 2018-09-22 DIAGNOSIS — Z793 Long term (current) use of hormonal contraceptives: Secondary | ICD-10-CM | POA: Diagnosis not present

## 2018-09-22 DIAGNOSIS — Z79899 Other long term (current) drug therapy: Secondary | ICD-10-CM | POA: Diagnosis not present

## 2018-09-22 DIAGNOSIS — D509 Iron deficiency anemia, unspecified: Secondary | ICD-10-CM

## 2018-09-22 HISTORY — PX: CYSTOSCOPY: SHX5120

## 2018-09-22 HISTORY — PX: TOTAL LAPAROSCOPIC HYSTERECTOMY WITH SALPINGECTOMY: SHX6742

## 2018-09-22 LAB — CBC
HCT: 30.2 % — ABNORMAL LOW (ref 36.0–46.0)
Hemoglobin: 9.7 g/dL — ABNORMAL LOW (ref 12.0–15.0)
MCH: 30 pg (ref 26.0–34.0)
MCHC: 32.1 g/dL (ref 30.0–36.0)
MCV: 93.5 fL (ref 80.0–100.0)
Platelets: 276 10*3/uL (ref 150–400)
RBC: 3.23 MIL/uL — AB (ref 3.87–5.11)
RDW: 13.5 % (ref 11.5–15.5)
WBC: 12.1 10*3/uL — ABNORMAL HIGH (ref 4.0–10.5)
nRBC: 0 % (ref 0.0–0.2)

## 2018-09-22 SURGERY — HYSTERECTOMY, TOTAL, LAPAROSCOPIC, WITH SALPINGECTOMY
Anesthesia: General | Site: Bladder

## 2018-09-22 MED ORDER — CELECOXIB 200 MG PO CAPS
400.0000 mg | ORAL_CAPSULE | ORAL | Status: AC
  Administered 2018-09-22: 400 mg via ORAL

## 2018-09-22 MED ORDER — PROPOFOL 10 MG/ML IV BOLUS
INTRAVENOUS | Status: AC
  Filled 2018-09-22: qty 20

## 2018-09-22 MED ORDER — DOCUSATE SODIUM 100 MG PO CAPS
100.0000 mg | ORAL_CAPSULE | Freq: Two times a day (BID) | ORAL | Status: DC
  Administered 2018-09-22 – 2018-09-23 (×2): 100 mg via ORAL
  Filled 2018-09-22 (×2): qty 1

## 2018-09-22 MED ORDER — SODIUM CHLORIDE (PF) 0.9 % IJ SOLN
INTRAMUSCULAR | Status: AC
  Filled 2018-09-22: qty 50

## 2018-09-22 MED ORDER — SUGAMMADEX SODIUM 200 MG/2ML IV SOLN
INTRAVENOUS | Status: DC | PRN
  Administered 2018-09-22: 200 mg via INTRAVENOUS

## 2018-09-22 MED ORDER — KCL IN DEXTROSE-NACL 20-5-0.45 MEQ/L-%-% IV SOLN
INTRAVENOUS | Status: DC
  Administered 2018-09-22: 19:00:00 via INTRAVENOUS
  Filled 2018-09-22 (×2): qty 1000

## 2018-09-22 MED ORDER — VASOPRESSIN 20 UNIT/ML IV SOLN
INTRAVENOUS | Status: AC
  Filled 2018-09-22: qty 1

## 2018-09-22 MED ORDER — LIDOCAINE HCL (CARDIAC) PF 100 MG/5ML IV SOSY
PREFILLED_SYRINGE | INTRAVENOUS | Status: DC | PRN
  Administered 2018-09-22: 90 mg via INTRAVENOUS

## 2018-09-22 MED ORDER — FENTANYL CITRATE (PF) 100 MCG/2ML IJ SOLN
INTRAMUSCULAR | Status: DC | PRN
  Administered 2018-09-22: 50 ug via INTRAVENOUS
  Administered 2018-09-22: 100 ug via INTRAVENOUS
  Administered 2018-09-22 (×2): 50 ug via INTRAVENOUS

## 2018-09-22 MED ORDER — ONDANSETRON HCL 4 MG/2ML IJ SOLN
INTRAMUSCULAR | Status: AC
  Filled 2018-09-22: qty 2

## 2018-09-22 MED ORDER — FENTANYL CITRATE (PF) 100 MCG/2ML IJ SOLN
25.0000 ug | INTRAMUSCULAR | Status: DC | PRN
  Administered 2018-09-22: 25 ug via INTRAVENOUS

## 2018-09-22 MED ORDER — KETOROLAC TROMETHAMINE 30 MG/ML IJ SOLN
INTRAMUSCULAR | Status: AC
  Filled 2018-09-22: qty 1

## 2018-09-22 MED ORDER — ALUM & MAG HYDROXIDE-SIMETH 200-200-20 MG/5ML PO SUSP: 30.0000 mL | ORAL | Status: DC | PRN

## 2018-09-22 MED ORDER — GABAPENTIN 300 MG PO CAPS
ORAL_CAPSULE | ORAL | Status: AC
  Administered 2018-09-22: 900 mg via ORAL
  Filled 2018-09-22: qty 3

## 2018-09-22 MED ORDER — TRAMADOL HCL 50 MG PO TABS: 50.0000 mg | ORAL_TABLET | Freq: Four times a day (QID) | ORAL | Status: DC | PRN

## 2018-09-22 MED ORDER — ROCURONIUM BROMIDE 100 MG/10ML IV SOLN
INTRAVENOUS | Status: AC
  Filled 2018-09-22: qty 1

## 2018-09-22 MED ORDER — ACETAMINOPHEN 500 MG PO TABS
1000.0000 mg | ORAL_TABLET | ORAL | Status: AC
  Administered 2018-09-22: 1000 mg via ORAL

## 2018-09-22 MED ORDER — FENTANYL CITRATE (PF) 250 MCG/5ML IJ SOLN
INTRAMUSCULAR | Status: AC
  Filled 2018-09-22: qty 5

## 2018-09-22 MED ORDER — ROPIVACAINE HCL 5 MG/ML IJ SOLN
INTRAMUSCULAR | Status: AC
  Filled 2018-09-22: qty 30

## 2018-09-22 MED ORDER — INFLUENZA VAC SPLIT QUAD 0.5 ML IM SUSY
0.5000 mL | PREFILLED_SYRINGE | INTRAMUSCULAR | Status: AC
  Administered 2018-09-23: 0.5 mL via INTRAMUSCULAR
  Filled 2018-09-22: qty 0.5

## 2018-09-22 MED ORDER — LIDOCAINE HCL (CARDIAC) PF 100 MG/5ML IV SOSY
PREFILLED_SYRINGE | INTRAVENOUS | Status: AC
  Filled 2018-09-22: qty 5

## 2018-09-22 MED ORDER — SODIUM CHLORIDE (PF) 0.9 % IJ SOLN
INTRAMUSCULAR | Status: AC
  Filled 2018-09-22: qty 100

## 2018-09-22 MED ORDER — HYDROMORPHONE HCL 1 MG/ML IJ SOLN
INTRAMUSCULAR | Status: DC | PRN
  Administered 2018-09-22: 0.5 mg via INTRAVENOUS
  Administered 2018-09-22: .25 mg via INTRAVENOUS

## 2018-09-22 MED ORDER — METOCLOPRAMIDE HCL 5 MG/ML IJ SOLN: 10.0000 mg | Freq: Once | INTRAMUSCULAR | Status: DC | PRN

## 2018-09-22 MED ORDER — HYDROMORPHONE HCL 1 MG/ML IJ SOLN: 0.2000 mg | INTRAMUSCULAR | Status: DC | PRN

## 2018-09-22 MED ORDER — BUPIVACAINE HCL (PF) 0.25 % IJ SOLN
INTRAMUSCULAR | Status: DC | PRN
  Administered 2018-09-22: 14 mL

## 2018-09-22 MED ORDER — MENTHOL 3 MG MT LOZG: 1.0000 | LOZENGE | OROMUCOSAL | Status: DC | PRN

## 2018-09-22 MED ORDER — SODIUM CHLORIDE 0.9 % IV SOLN
INTRAVENOUS | Status: AC
  Filled 2018-09-22: qty 2

## 2018-09-22 MED ORDER — DEXAMETHASONE SODIUM PHOSPHATE 10 MG/ML IJ SOLN
INTRAMUSCULAR | Status: DC | PRN
  Administered 2018-09-22: 10 mg via INTRAVENOUS

## 2018-09-22 MED ORDER — KETOROLAC TROMETHAMINE 30 MG/ML IJ SOLN
INTRAMUSCULAR | Status: DC | PRN
  Administered 2018-09-22: 30 mg via INTRAVENOUS

## 2018-09-22 MED ORDER — SCOPOLAMINE 1 MG/3DAYS TD PT72
1.0000 | MEDICATED_PATCH | Freq: Once | TRANSDERMAL | Status: DC
  Administered 2018-09-22: 1.5 mg via TRANSDERMAL

## 2018-09-22 MED ORDER — SODIUM CHLORIDE 0.9 % IV SOLN
2.0000 g | INTRAVENOUS | Status: AC
  Administered 2018-09-22: 2 g via INTRAVENOUS

## 2018-09-22 MED ORDER — PANTOPRAZOLE SODIUM 40 MG IV SOLR
40.0000 mg | Freq: Every day | INTRAVENOUS | Status: DC
  Administered 2018-09-22: 40 mg via INTRAVENOUS
  Filled 2018-09-22 (×2): qty 40

## 2018-09-22 MED ORDER — LACTATED RINGERS IV SOLN
INTRAVENOUS | Status: DC
  Administered 2018-09-22 (×3): via INTRAVENOUS
  Administered 2018-09-22: 125 mL/h via INTRAVENOUS

## 2018-09-22 MED ORDER — ONDANSETRON HCL 4 MG PO TABS: 4.0000 mg | ORAL_TABLET | Freq: Four times a day (QID) | ORAL | Status: DC | PRN

## 2018-09-22 MED ORDER — SODIUM CHLORIDE 0.9 % IV SOLN
INTRAVENOUS | Status: DC | PRN
  Administered 2018-09-22: 60 mL

## 2018-09-22 MED ORDER — VASOPRESSIN 20 UNIT/ML IV SOLN
INTRAVENOUS | Status: DC | PRN
  Administered 2018-09-22: 3 mL via INTRAMUSCULAR
  Administered 2018-09-22: 7 mL via INTRAMUSCULAR

## 2018-09-22 MED ORDER — MIDAZOLAM HCL 2 MG/2ML IJ SOLN
INTRAMUSCULAR | Status: DC | PRN
  Administered 2018-09-22: 2 mg via INTRAVENOUS

## 2018-09-22 MED ORDER — ROCURONIUM BROMIDE 100 MG/10ML IV SOLN
INTRAVENOUS | Status: DC | PRN
  Administered 2018-09-22 (×2): 10 mg via INTRAVENOUS
  Administered 2018-09-22: 40 mg via INTRAVENOUS
  Administered 2018-09-22 (×4): 10 mg via INTRAVENOUS

## 2018-09-22 MED ORDER — OXYCODONE-ACETAMINOPHEN 5-325 MG PO TABS
1.0000 | ORAL_TABLET | ORAL | Status: DC | PRN
  Administered 2018-09-22: 1 via ORAL
  Administered 2018-09-22: 2 via ORAL
  Filled 2018-09-22: qty 2
  Filled 2018-09-22: qty 1

## 2018-09-22 MED ORDER — ONDANSETRON HCL 4 MG/2ML IJ SOLN
INTRAMUSCULAR | Status: DC | PRN
  Administered 2018-09-22: 4 mg via INTRAVENOUS

## 2018-09-22 MED ORDER — BUPIVACAINE HCL (PF) 0.25 % IJ SOLN
INTRAMUSCULAR | Status: AC
  Filled 2018-09-22: qty 30

## 2018-09-22 MED ORDER — STERILE WATER FOR IRRIGATION IR SOLN
Status: DC | PRN
  Administered 2018-09-22: 250 mL

## 2018-09-22 MED ORDER — MEPERIDINE HCL 25 MG/ML IJ SOLN: 6.2500 mg | INTRAMUSCULAR | Status: DC | PRN

## 2018-09-22 MED ORDER — IBUPROFEN 800 MG PO TABS: 800.0000 mg | ORAL_TABLET | Freq: Four times a day (QID) | ORAL | Status: DC

## 2018-09-22 MED ORDER — LACTATED RINGERS IV SOLN: INTRAVENOUS | Status: DC

## 2018-09-22 MED ORDER — CELECOXIB 200 MG PO CAPS
ORAL_CAPSULE | ORAL | Status: AC
  Administered 2018-09-22: 400 mg via ORAL
  Filled 2018-09-22: qty 2

## 2018-09-22 MED ORDER — GABAPENTIN 300 MG PO CAPS
900.0000 mg | ORAL_CAPSULE | ORAL | Status: AC
  Administered 2018-09-22: 900 mg via ORAL

## 2018-09-22 MED ORDER — ENOXAPARIN SODIUM 40 MG/0.4ML ~~LOC~~ SOLN: 40.0000 mg | SUBCUTANEOUS | Status: DC

## 2018-09-22 MED ORDER — DEXAMETHASONE SODIUM PHOSPHATE 10 MG/ML IJ SOLN
INTRAMUSCULAR | Status: AC
  Filled 2018-09-22: qty 1

## 2018-09-22 MED ORDER — PROPOFOL 10 MG/ML IV BOLUS
INTRAVENOUS | Status: DC | PRN
  Administered 2018-09-22: 170 mg via INTRAVENOUS

## 2018-09-22 MED ORDER — FENTANYL CITRATE (PF) 100 MCG/2ML IJ SOLN
INTRAMUSCULAR | Status: AC
  Filled 2018-09-22: qty 2

## 2018-09-22 MED ORDER — ACETAMINOPHEN 500 MG PO TABS
ORAL_TABLET | ORAL | Status: AC
  Administered 2018-09-22: 1000 mg via ORAL
  Filled 2018-09-22: qty 2

## 2018-09-22 MED ORDER — SUGAMMADEX SODIUM 200 MG/2ML IV SOLN
INTRAVENOUS | Status: AC
  Filled 2018-09-22: qty 2

## 2018-09-22 MED ORDER — SODIUM CHLORIDE 0.9 % IR SOLN
Status: DC | PRN
  Administered 2018-09-22: 600 mL

## 2018-09-22 MED ORDER — KETOROLAC TROMETHAMINE 30 MG/ML IJ SOLN: 30.0000 mg | Freq: Once | INTRAMUSCULAR | Status: DC

## 2018-09-22 MED ORDER — ZOLPIDEM TARTRATE 5 MG PO TABS: 5.0000 mg | ORAL_TABLET | Freq: Every evening | ORAL | Status: DC | PRN

## 2018-09-22 MED ORDER — MIDAZOLAM HCL 2 MG/2ML IJ SOLN
INTRAMUSCULAR | Status: AC
  Filled 2018-09-22: qty 2

## 2018-09-22 MED ORDER — SCOPOLAMINE 1 MG/3DAYS TD PT72
MEDICATED_PATCH | TRANSDERMAL | Status: AC
  Administered 2018-09-22: 1.5 mg via TRANSDERMAL
  Filled 2018-09-22: qty 1

## 2018-09-22 MED ORDER — ONDANSETRON HCL 4 MG/2ML IJ SOLN: 4.0000 mg | Freq: Four times a day (QID) | INTRAMUSCULAR | Status: DC | PRN

## 2018-09-22 MED ORDER — ENOXAPARIN SODIUM 40 MG/0.4ML ~~LOC~~ SOLN
SUBCUTANEOUS | Status: AC
  Administered 2018-09-22: 40 mg via SUBCUTANEOUS
  Filled 2018-09-22: qty 0.4

## 2018-09-22 MED ORDER — KETOROLAC TROMETHAMINE 30 MG/ML IJ SOLN
30.0000 mg | Freq: Four times a day (QID) | INTRAMUSCULAR | Status: DC
  Administered 2018-09-22 – 2018-09-23 (×3): 30 mg via INTRAVENOUS
  Filled 2018-09-22 (×3): qty 1

## 2018-09-22 MED ORDER — HYDROMORPHONE HCL 1 MG/ML IJ SOLN
INTRAMUSCULAR | Status: AC
  Filled 2018-09-22: qty 1

## 2018-09-22 MED ORDER — ENOXAPARIN SODIUM 40 MG/0.4ML ~~LOC~~ SOLN
40.0000 mg | SUBCUTANEOUS | Status: AC
  Administered 2018-09-22: 40 mg via SUBCUTANEOUS

## 2018-09-22 MED ORDER — STERILE WATER FOR IRRIGATION IR SOLN
Status: DC | PRN
  Administered 2018-09-22: 150 mL via INTRAVESICAL

## 2018-09-22 SURGICAL SUPPLY — 64 items
ADH SKN CLS APL DERMABOND .7 (GAUZE/BANDAGES/DRESSINGS) ×2
APL SRG 38 LTWT LNG FL B (MISCELLANEOUS) ×2
APL SWBSTK 6 STRL LF DISP (MISCELLANEOUS) ×4
APPLICATOR ARISTA FLEXITIP XL (MISCELLANEOUS) ×3 IMPLANT
APPLICATOR COTTON TIP 6 STRL (MISCELLANEOUS) ×4 IMPLANT
APPLICATOR COTTON TIP 6IN STRL (MISCELLANEOUS) ×6
CABLE HIGH FREQUENCY MONO STRZ (ELECTRODE) IMPLANT
CANISTER SUCT 3000ML PPV (MISCELLANEOUS) ×3 IMPLANT
CELL SAVER LIPIGURD (MISCELLANEOUS) IMPLANT
COVER LIGHT HANDLE  1/PK (MISCELLANEOUS) ×1
COVER LIGHT HANDLE 1/PK (MISCELLANEOUS) ×2 IMPLANT
COVER MAYO STAND STRL (DRAPES) ×3 IMPLANT
DECANTER SPIKE VIAL GLASS SM (MISCELLANEOUS) ×9 IMPLANT
DERMABOND ADVANCED (GAUZE/BANDAGES/DRESSINGS) ×1
DERMABOND ADVANCED .7 DNX12 (GAUZE/BANDAGES/DRESSINGS) ×2 IMPLANT
DURAPREP 26ML APPLICATOR (WOUND CARE) ×3 IMPLANT
EXTRT SYSTEM ALEXIS 14CM (MISCELLANEOUS)
EXTRT SYSTEM ALEXIS 17CM (MISCELLANEOUS) ×3
GLOVE BIOGEL PI IND STRL 7.0 (GLOVE) ×10 IMPLANT
GLOVE BIOGEL PI INDICATOR 7.0 (GLOVE) ×5
GLOVE ECLIPSE 6.5 STRL STRAW (GLOVE) ×3 IMPLANT
GOWN STRL REUS W/TWL LRG LVL3 (GOWN DISPOSABLE) ×15 IMPLANT
HARMONIC RUM II 2.5CM SILVER (DISPOSABLE)
HARMONIC RUM II 3.0CM SILVER (DISPOSABLE) ×3
HARMONIC RUM II 3.5CM SILVER (DISPOSABLE)
HARMONIC RUM II 4.0CM SILVER (DISPOSABLE)
HEMOSTAT ARISTA ABSORB 3G PWDR (MISCELLANEOUS) ×3 IMPLANT
HIBICLENS CHG 4% 4OZ BTL (MISCELLANEOUS) ×3 IMPLANT
LIGASURE VESSEL 5MM BLUNT TIP (ELECTROSURGICAL) ×3 IMPLANT
NEEDLE INSUFFLATION 120MM (ENDOMECHANICALS) ×3 IMPLANT
PACK LAPAROSCOPY BASIN (CUSTOM PROCEDURE TRAY) ×3 IMPLANT
PACK TRENDGUARD 450 HYBRID PRO (MISCELLANEOUS) ×2 IMPLANT
POUCH LAPAROSCOPIC INSTRUMENT (MISCELLANEOUS) ×3 IMPLANT
PROTECTOR NERVE ULNAR (MISCELLANEOUS) ×6 IMPLANT
SCALPEL HRMNC RUM II 2.5 SILVR (DISPOSABLE) IMPLANT
SCALPEL HRMNC RUM II 3.0 SILVR (DISPOSABLE) ×2 IMPLANT
SCALPEL HRMNC RUM II 3.5 SILVR (DISPOSABLE) IMPLANT
SCALPEL HRMNC RUM II 4.0 SILVR (DISPOSABLE) IMPLANT
SCISSORS LAP 5X35 DISP (ENDOMECHANICALS) IMPLANT
SET CYSTO W/LG BORE CLAMP LF (SET/KITS/TRAYS/PACK) ×6 IMPLANT
SET IRRIG TUBING LAPAROSCOPIC (IRRIGATION / IRRIGATOR) ×3 IMPLANT
SET TRI-LUMEN FLTR TB AIRSEAL (TUBING) ×3 IMPLANT
SHEARS HARMONIC ACE PLUS 36CM (ENDOMECHANICALS) ×3 IMPLANT
SUT VIC AB 0 CT1 27 (SUTURE) ×2
SUT VIC AB 0 CT1 27XBRD ANBCTR (SUTURE) ×2 IMPLANT
SUT VICRYL 0 UR6 27IN ABS (SUTURE) ×3 IMPLANT
SUT VICRYL 4-0 PS2 18IN ABS (SUTURE) ×3 IMPLANT
SUT VLOC 180 0 9IN  GS21 (SUTURE)
SUT VLOC 180 0 9IN GS21 (SUTURE) IMPLANT
SYR 50ML LL SCALE MARK (SYRINGE) ×6 IMPLANT
SYSTEM CONTND EXTRCTN KII BLLN (MISCELLANEOUS) ×2 IMPLANT
TIP RUMI ORANGE 6.7MMX12CM (TIP) ×3 IMPLANT
TIP UTERINE 5.1X6CM LAV DISP (MISCELLANEOUS) IMPLANT
TIP UTERINE 6.7X10CM GRN DISP (MISCELLANEOUS) IMPLANT
TIP UTERINE 6.7X6CM WHT DISP (MISCELLANEOUS) IMPLANT
TIP UTERINE 6.7X8CM BLUE DISP (MISCELLANEOUS) IMPLANT
TOWEL OR 17X24 6PK STRL BLUE (TOWEL DISPOSABLE) ×6 IMPLANT
TRAY FOLEY W/BAG SLVR 14FR (SET/KITS/TRAYS/PACK) ×3 IMPLANT
TRENDGUARD 450 HYBRID PRO PACK (MISCELLANEOUS) ×3
TROCAR ADV FIXATION 5X100MM (TROCAR) ×3 IMPLANT
TROCAR PORT AIRSEAL 5X120 (TROCAR) ×3 IMPLANT
TROCAR XCEL NON BLADE 8MM B8LT (ENDOMECHANICALS) ×3 IMPLANT
TROCAR XCEL NON-BLD 5MMX100MML (ENDOMECHANICALS) ×3 IMPLANT
WARMER LAPAROSCOPE (MISCELLANEOUS) ×3 IMPLANT

## 2018-09-22 NOTE — OR Nursing (Signed)
At Dr. Gentry Fitz request I called and updated family on surgery progress

## 2018-09-22 NOTE — Anesthesia Procedure Notes (Signed)
Procedure Name: Intubation Date/Time: 09/22/2018 9:57 AM Performed by: Asher Muir, CRNA Pre-anesthesia Checklist: Patient identified, Emergency Drugs available, Suction available and Patient being monitored Patient Re-evaluated:Patient Re-evaluated prior to induction Oxygen Delivery Method: Circle system utilized and Simple face mask Preoxygenation: Pre-oxygenation with 100% oxygen Induction Type: IV induction Ventilation: Mask ventilation without difficulty Laryngoscope Size: Mac and 3 Grade View: Grade II Tube type: Oral Tube size: 7.0 mm Number of attempts: 1 Airway Equipment and Method: Stylet Placement Confirmation: ETT inserted through vocal cords under direct vision,  positive ETCO2 and breath sounds checked- equal and bilateral Secured at: 20 (right lip) cm Tube secured with: Tape Dental Injury: Teeth and Oropharynx as per pre-operative assessment

## 2018-09-22 NOTE — Interval H&P Note (Signed)
History and Physical Interval Note:  09/22/2018 9:02 AM  Cathy Hernandez  has presented today for surgery, with the diagnosis of symptomatic uterine fibroids, anemia  The various methods of treatment have been discussed with the patient and family. After consideration of risks, benefits and other options for treatment, the patient has consented to  Procedure(s) with comments: TOTAL LAPAROSCOPIC HYSTERECTOMY WITH SALPINGECTOMY (Bilateral) - extended recovery bed CYSTOSCOPY (N/A) as a surgical intervention .  The patient's history has been reviewed, patient examined, no change in status, stable for surgery.  I have reviewed the patient's chart and labs.  Questions were answered to the patient's satisfaction.     Salvadore Dom

## 2018-09-22 NOTE — Anesthesia Preprocedure Evaluation (Signed)
Anesthesia Evaluation  Patient identified by MRN, date of birth, ID band Patient awake    Reviewed: Allergy & Precautions, NPO status , Patient's Chart, lab work & pertinent test results  Airway Mallampati: II  TM Distance: >3 FB Neck ROM: Full    Dental no notable dental hx.    Pulmonary neg pulmonary ROS,    Pulmonary exam normal breath sounds clear to auscultation       Cardiovascular negative cardio ROS Normal cardiovascular exam Rhythm:Regular Rate:Normal     Neuro/Psych negative neurological ROS  negative psych ROS   GI/Hepatic negative GI ROS, Neg liver ROS,   Endo/Other  negative endocrine ROS  Renal/GU negative Renal ROS  negative genitourinary   Musculoskeletal negative musculoskeletal ROS (+)   Abdominal   Peds negative pediatric ROS (+)  Hematology negative hematology ROS (+)   Anesthesia Other Findings   Reproductive/Obstetrics negative OB ROS                             Anesthesia Physical Anesthesia Plan  ASA: II  Anesthesia Plan: General   Post-op Pain Management:    Induction: Intravenous  PONV Risk Score and Plan: 4 or greater and Ondansetron, Dexamethasone, Midazolam and Scopolamine patch - Pre-op  Airway Management Planned: Oral ETT  Additional Equipment:   Intra-op Plan:   Post-operative Plan: Extubation in OR  Informed Consent: I have reviewed the patients History and Physical, chart, labs and discussed the procedure including the risks, benefits and alternatives for the proposed anesthesia with the patient or authorized representative who has indicated his/her understanding and acceptance.   Dental advisory given  Plan Discussed with: CRNA  Anesthesia Plan Comments:         Anesthesia Quick Evaluation

## 2018-09-22 NOTE — Progress Notes (Signed)
Day of Surgery Procedure(s) (LRB): TOTAL LAPAROSCOPIC HYSTERECTOMY WITH SALPINGECTOMY (Bilateral) CYSTOSCOPY (N/A)  Subjective: Patient reports that she is feeling okay, no significant pain, feels like she needs to void. When she sat up for lung exam she said she felt slightly dizzy.    Objective: I have reviewed patient's vital signs.  Today's Vitals   09/22/18 1545 09/22/18 1600 09/22/18 1624 09/22/18 1631  BP: (!) 124/93 137/86 136/83   Pulse: 86 85 89   Resp: (!) 21 18 17    Temp:   98.2 F (36.8 C)   TempSrc:   Oral   SpO2: 97% 98% 98%   PainSc:    0-No pain     General: alert, cooperative and no distress Resp: clear to auscultation bilaterally Cardio: S1, S2 normal GI: soft, not tender, mildly distended, hypoactive BS Extremities: extremities normal, atraumatic, no cyanosis or edema  Incisions: clean, dry and intact without erythema. Bandage over the umbilical incision, the nurse reports that she changed it and put a pressure dressing on it.   Assessment: s/p Procedure(s) with comments: TOTAL LAPAROSCOPIC HYSTERECTOMY WITH SALPINGECTOMY (Bilateral) - extended recovery bed CYSTOSCOPY (N/A): stable and progressing well  Plan: Advance diet check CBC now  Ambulate as tolerated  Anticipate d/c in the am   LOS: 0 days    Salvadore Dom 09/22/2018, 4:50 PM

## 2018-09-22 NOTE — Anesthesia Procedure Notes (Signed)
Performed by: Asher Muir, CRNA

## 2018-09-22 NOTE — Transfer of Care (Signed)
Immediate Anesthesia Transfer of Care Note  Patient: Cathy Hernandez  Procedure(s) Performed: TOTAL LAPAROSCOPIC HYSTERECTOMY WITH SALPINGECTOMY (Bilateral Abdomen) CYSTOSCOPY (N/A Bladder)  Patient Location: PACU  Anesthesia Type:General  Level of Consciousness: sedated  Airway & Oxygen Therapy: Patient Spontanous Breathing and Patient connected to nasal cannula oxygen  Post-op Assessment: Report given to RN  Post vital signs: Reviewed and stable  Last Vitals:  Vitals Value Taken Time  BP    Temp    Pulse    Resp    SpO2      Last Pain:  Vitals:   09/22/18 0812  TempSrc: Oral  PainSc: 0-No pain      Patients Stated Pain Goal: 2 (56/97/94 8016)  Complications: No apparent anesthesia complications

## 2018-09-22 NOTE — Op Note (Addendum)
Preoperative Diagnosis: Fibroid uterus, menorrhagia, anemia  Postoperative Diagnosis: Same  Procedure:  Total Laparoscopic Hysterectomy with bilateral salpingectomies and cystoscopy  Surgeon: Dr Sumner Boast  Assistant: Dr Edwinna Areola, an MD assistant was necessary for tissue manipulation, retraction and positioning due to the complexity of the case and hospital policies  Anesthesia: General  EBL: 400 cc  Fluids: 2,400 cc LR  Urine output: 062 cc  Complications: none  Indications for surgery: The patient is a 47 year old female, who presented in 7/17 with menorrhagia and a recently diagnosed fibroid uterus. Initially her bleeding was controlled on OCP's and her anemia resolved. Over the summer her bleeding got heavier and her hgb was down to 7.4. An ultrasound showed enlargement of her fibroid uterus. Endometrial biopsy was benign, pap was normal. Her insurance denied coverage for lupron. She was changed to a different OCP's and received several iron transfusions. She desires definitive surgery..  The patient is aware of the risks and complications involved with the surgery and consent was obtained prior to the procedure.  Findings: EUA: 20 week sized fibroid uterus, filling her lower abdomen. Laparoscopy: fibroid uterus, normal adnexa bilaterally, normal liver edge.   Procedure: The patient was taken to the operating room with an IV in placed, preoperative antibiotics had been administered. She was placed in the dorsal lithotomy position. General anesthesia was administered. She was prepped and draped in the usual sterile fashion for an abdominal, vaginal surgery. A rumi uterine manipulator was placed, using a # 3 cup and a 12 cm extender. A foley catheter was placed.    Given the large size of the uterus a left upper quadrant entry was chosen. An OG tube was placed. Local was placed approximately 2 cm below the mid cage in the mid clavicular line. The area was incised with a scalpel  and a #5 opti-view trocar was inserted with direct visualization with the laparoscope. The abdominal cavity was insufflated with CO2. The patient was placed in trendelenburg and the abdominal pelvic cavity was inspected. 3 more trocars were placed: 1 in each mid lateral quadrant approximately and one in the umbilicus. These areas were injected with 0.25% marcaine, incised with a #11 blade and all trocars were inserted with direct visualization with the laparoscope. A # 5 airseal trocar was placed in the right lateral abdomen, a 5 mm trocar in the left lateral abdomen and a #8 trocar in the umbilicus. The abdominal pelvic cavity was again inspected. A mixture of 30 cc of Robivacaine and 30 cc of NS was place in the pelvic cavity.   The left tube was elevated from the pelvic sidewall, cauterized and cut with the ligasure device. The mesosalpinx was cauterized and cut with the ligasure device.  The tube was separated from the uterus using the ligasure device and removed through the midline trocar. The left uteroovarian ligament was cauterized and cut with the ligasure device. The left round ligament was cauterized and cut with the ligasure device and the anterior and posterior leafs of the broad ligament were taken down with the harmonic scalpel. The harmonic scalpel was then used to take down the bladder flap and skeltonize the vessels. The left uterine vessels were then clamped and cauterized at this time, they were not cut until after the right uterine vessels had been secured. The same procedure was repeated on the right. The vessels were bilaterally clamped, cauterized and ligated with the ligasure device.   The uterus was very large and bulky. Manipulation of  the uterus was challenging throughout the case. A 30 degree scope was used for the majority of the surgery.    Using the rumi manipulator the uterus was pushed up in the pelvic cavity and the harmonic scalpel was used to separate the cervix from the  vagina using the harmonic energy. The rumi was removed, the uterus was too large to come into the vagina for morcellation.The large alexis bag was placed into the abdominal cavity and the uterus was placed into the bag. An occluder was placed in the vagina to maintain pneumoperitoneum.   The vaginal cuff was then closed with a 0 V-lock suture.   The umbilical incision was extended to an approximately 5 cm incision, the fascia was extended as well. The pneumoperitoneum was released and the gas was stopped. The end of the bag was brought out through the incision. The uterus was then morcellated while being contained within the bag. The bag remained intact. The fascia was then closed with a running stitch of 0 Vicryl.   The abdomen was reinflated with CO2.The abdominal pelvic cavity was irrigated and suctioned dry. Hemostasis was excellent. Pressure was released and hemostasis remained excellent.   The abdominal cavity was desufflated and the trocars were removed. The skin was closed with subcuticular stiches of 4-0 vicryl and dermabond was placed over the incisions.  The foley catheter was removed and cystoscopy was performed using a 70 degree scope. Both ureters expelled urine, no bladder abnormalities were noted. The bladder was allowed to drain and the cystoscope was removed.   The patient's abdomen and perineum were cleansed and she was taken out of the dorsal lithotomy position. Upon awakening she was extubated and taken to the recovery room in stable condition. The sponge and instrument counts were correct.    Addendum: the uterine weight was 764.1 grams. The case took over 4 hours, over twice the time of a typical hysterectomy.

## 2018-09-23 DIAGNOSIS — D259 Leiomyoma of uterus, unspecified: Secondary | ICD-10-CM | POA: Diagnosis not present

## 2018-09-23 LAB — CBC
HCT: 25.1 % — ABNORMAL LOW (ref 36.0–46.0)
HCT: 24.8 % — ABNORMAL LOW (ref 36.0–46.0)
Hemoglobin: 8.2 g/dL — ABNORMAL LOW (ref 12.0–15.0)
Hemoglobin: 8.1 g/dL — ABNORMAL LOW (ref 12.0–15.0)
MCH: 30.4 pg (ref 26.0–34.0)
MCH: 30.6 pg (ref 26.0–34.0)
MCHC: 32.7 g/dL (ref 30.0–36.0)
MCHC: 32.7 g/dL (ref 30.0–36.0)
MCV: 93 fL (ref 80.0–100.0)
MCV: 93.6 fL (ref 80.0–100.0)
Platelets: 256 10*3/uL (ref 150–400)
Platelets: 245 10*3/uL (ref 150–400)
RBC: 2.7 MIL/uL — ABNORMAL LOW (ref 3.87–5.11)
RBC: 2.65 MIL/uL — ABNORMAL LOW (ref 3.87–5.11)
RDW: 13.8 % (ref 11.5–15.5)
RDW: 13.9 % (ref 11.5–15.5)
WBC: 8.3 10*3/uL (ref 4.0–10.5)
WBC: 8.5 10*3/uL (ref 4.0–10.5)
nRBC: 0 % (ref 0.0–0.2)
nRBC: 0 % (ref 0.0–0.2)

## 2018-09-23 MED ORDER — TRAMADOL HCL 50 MG PO TABS: 50.0000 mg | ORAL_TABLET | Freq: Four times a day (QID) | ORAL | 0 refills | Status: DC | PRN

## 2018-09-23 MED ORDER — DOCUSATE SODIUM 100 MG PO CAPS: 100.0000 mg | ORAL_CAPSULE | Freq: Two times a day (BID) | ORAL | 0 refills | Status: DC

## 2018-09-23 MED ORDER — IBUPROFEN 800 MG PO TABS: 800.0000 mg | ORAL_TABLET | Freq: Three times a day (TID) | ORAL | 0 refills | Status: DC | PRN

## 2018-09-23 NOTE — Anesthesia Postprocedure Evaluation (Signed)
Anesthesia Post Note  Patient: Cathy Hernandez  Procedure(s) Performed: TOTAL LAPAROSCOPIC HYSTERECTOMY WITH SALPINGECTOMY (Bilateral Abdomen) CYSTOSCOPY (N/A Bladder)     Patient location during evaluation: PACU Anesthesia Type: General Level of consciousness: awake and alert Pain management: pain level controlled Vital Signs Assessment: post-procedure vital signs reviewed and stable Respiratory status: spontaneous breathing, nonlabored ventilation, respiratory function stable and patient connected to nasal cannula oxygen Cardiovascular status: blood pressure returned to baseline and stable Postop Assessment: no apparent nausea or vomiting Anesthetic complications: no    Last Vitals:  Vitals:   09/22/18 2314 09/23/18 0411  BP: 109/67 (!) 104/58  Pulse: 79 74  Resp: 18 18  Temp: 37.6 C 36.7 C  SpO2: 99% 98%    Last Pain:  Vitals:   09/23/18 0559  TempSrc:   PainSc: 0-No pain                 Montez Hageman

## 2018-09-23 NOTE — Telephone Encounter (Signed)
Patient post op currently. Has appointment 09/29/18 for post op. Note placed in appointment to schedule 04 Breast Imaging.

## 2018-09-23 NOTE — Discharge Summary (Signed)
Physician Discharge Summary  Patient ID: Cathy Hernandez MRN: [id number] DOB/AGE: 13-Mar-1971 47 y.o.  Admit date: 09/22/2018 Discharge date: 09/23/2018  Admission Diagnoses: fibroid uterus, menorrhagia, anemia  Discharge Diagnoses:  Active Problems:   Fibroid uterus   Status post laparoscopic hysterectomy   Discharged Condition: good  Hospital Course: uncomplicated  Consults: None  Treatments: surgery: Total laparoscopic hysterectomy, bilateral salpingectomies, cystoscopy  Discharge Exam: Blood pressure 110/70, pulse 67, temperature 99.3 F (37.4 C), temperature source Oral, resp. rate 16, height 5\' 3"  (1.6 m), weight 69.6 kg, last menstrual period 09/13/2018, SpO2 100 %.  CBC Latest Ref Rng & Units 09/23/2018 09/23/2018 09/22/2018  WBC 4.0 - 10.5 K/uL 8.5 8.3 12.1(H)  Hemoglobin 12.0 - 15.0 g/dL 8.1(L) 8.2(L) 9.7(L)  Hematocrit 36.0 - 46.0 % 24.8(L) 25.1(L) 30.2(L)  Platelets 150 - 400 K/uL 245 256 276   I/O last 3 completed shifts: In: 4103 [P.O.:480; I.V.:3623] Out: 2350 [Urine:1950; Blood:400] No intake/output data recorded.     Disposition: Discharge disposition: 01-Home or Self Care       Discharge Instructions    Call MD for:   Complete by:  As directed    Heavy vaginal bleeding   Call MD for:  difficulty breathing, headache or visual disturbances   Complete by:  As directed    Call MD for:  extreme fatigue   Complete by:  As directed    Call MD for:  hives   Complete by:  As directed    Call MD for:  persistant dizziness or light-headedness   Complete by:  As directed    Call MD for:  persistant nausea and vomiting   Complete by:  As directed    Call MD for:  redness, tenderness, or signs of infection (pain, swelling, redness, odor or green/yellow discharge around incision site)   Complete by:  As directed    Call MD for:  severe uncontrolled pain   Complete by:  As directed    Call MD for:  temperature >100.4   Complete by:  As directed    Diet general   Complete by:  As directed    Driving Restrictions   Complete by:  As directed    No driving while taking tramadol or until you can slam on the brakes   Increase activity slowly   Complete by:  As directed    May shower / Bathe   Complete by:  As directed    May shower, no tub baths   Remove dressing in 24 hours   Complete by:  As directed    Sexual Activity Restrictions   Complete by:  As directed    No intercourse for 2-3 months     Allergies as of 09/23/2018   No Known Allergies     Medication List    STOP taking these medications   IRON-VITAMIN C PO     TAKE these medications   docusate sodium 100 MG capsule Commonly known as:  COLACE Take 1 capsule (100 mg total) by mouth 2 (two) times daily.   ibuprofen 800 MG tablet Commonly known as:  ADVIL,MOTRIN Take 1 tablet (800 mg total) by mouth every 8 (eight) hours as needed. What changed:    medication strength  how much to take   multivitamin tablet Take 1 tablet by mouth daily.   traMADol 50 MG tablet Commonly known as:  ULTRAM Take 1 tablet (50 mg total) by mouth every 6 (six) hours as needed (mild pain).   Vitamin D (  Ergocalciferol) 1.25 MG (50000 UT) Caps capsule Commonly known as:  DRISDOL ONE CAPSULE BY MOUTH ONCE A WEEK FOR 8 WEEKS. THEN TAKE 1000IU/DAY What changed:  See the new instructions.        Signed: Salvadore Dom 09/23/2018, 11:53 AM

## 2018-09-23 NOTE — Progress Notes (Signed)
1 Day Post-Op Procedure(s) (LRB): TOTAL LAPAROSCOPIC HYSTERECTOMY WITH SALPINGECTOMY (Bilateral) CYSTOSCOPY (N/A)  Subjective: Patient is doing well, states her pain is well controlled. She is ambulating, voiding well, tolerating po. Not light headed or dizzy. On discussing her anemia, she reports that she had a very heavy cycle the week prior to her surgery. Pre-op CBC was done prior to her cycle.   Objective: I have reviewed patient's vital signs, intake and output and labs.  I/O last 3 completed shifts: In: 4103 [P.O.:480; I.V.:3623] Out: 2350 [Urine:1950; Blood:400] No intake/output data recorded.  Today's Vitals   09/22/18 2257 09/22/18 2314 09/23/18 0411 09/23/18 0559  BP:  109/67 (!) 104/58   Pulse:  79 74   Resp:  18 18   Temp:  99.7 F (37.6 C) 98.1 F (36.7 C)   TempSrc:  Oral Oral   SpO2:  99% 98%   Weight:      Height:      PainSc: 1    0-No pain   Body mass index is 27.17 kg/m.    CBC Latest Ref Rng & Units 09/23/2018 09/22/2018 09/15/2018  WBC 4.0 - 10.5 K/uL 8.3 12.1(H) 4.5  Hemoglobin 12.0 - 15.0 g/dL 8.2(L) 9.7(L) 13.0  Hematocrit 36.0 - 46.0 % 25.1(L) 30.2(L) 40.3  Platelets 150 - 400 K/uL 256 276 241     General: alert, cooperative and no distress Resp: clear to auscultation bilaterally Cardio: S1, S2 normal GI: soft, non-tender; bowel sounds normal; no masses,  no organomegaly and incision: clean, dry and intact Extremities: extremities normal, atraumatic, no cyanosis or edema Vaginal Bleeding: none  Assessment: s/p Procedure(s) with comments: TOTAL LAPAROSCOPIC HYSTERECTOMY WITH SALPINGECTOMY (Bilateral) - extended recovery bed CYSTOSCOPY (N/A): she is doing well, pain well controlled, normal vitals and output. Her Hgb from pre-op has dropped more than expected, but I suspect her pre-op Hgb isn't true representation. She reports a very heavy cycle after her lab work and prior to her cycle. No clinical signs of bleeding.   Plan: I will repeat  a CBC 6 hours after her last draw, if stable will d/c to home   LOS: 0 days    Salvadore Dom 09/23/2018, 7:50 AM

## 2018-09-24 ENCOUNTER — Encounter (HOSPITAL_COMMUNITY): Payer: Self-pay | Admitting: Obstetrics and Gynecology

## 2018-09-29 ENCOUNTER — Encounter: Payer: Self-pay | Admitting: Obstetrics and Gynecology

## 2018-09-29 ENCOUNTER — Ambulatory Visit (INDEPENDENT_AMBULATORY_CARE_PROVIDER_SITE_OTHER): Payer: BC Managed Care – PPO | Admitting: Obstetrics and Gynecology

## 2018-09-29 VITALS — BP 120/68 | HR 80 | Resp 16 | Ht 64.0 in | Wt 150.0 lb

## 2018-09-29 DIAGNOSIS — Z90710 Acquired absence of both cervix and uterus: Secondary | ICD-10-CM

## 2018-09-29 DIAGNOSIS — D509 Iron deficiency anemia, unspecified: Secondary | ICD-10-CM

## 2018-09-29 DIAGNOSIS — N6320 Unspecified lump in the left breast, unspecified quadrant: Secondary | ICD-10-CM

## 2018-09-29 NOTE — Progress Notes (Signed)
GYNECOLOGY  VISIT   HPI: 47 y.o.   Single Black or African American Not Hispanic or Latino  female   [phone] with Patient's last menstrual period was 09/13/2018 (exact date).   here for 1 week post op s/p TLH/BS.  Overall feeling well. Voiding and having BM. She is tired, more so with walking (ie in the office). She hasn't needed tramadol since the first day out. She is taking ibuprofen. No significant pain.   Need to see triage to schedule follow up bilateral diagnostic mammogram and left breast ultrasound that is due.  GYNECOLOGIC HISTORY: Patient's last menstrual period was 09/13/2018 (exact date). Contraception: Hysterectomy  Menopausal hormone therapy: None        OB History    Gravida  4   Para  2   Term  2   Preterm      AB  2   Living  2     SAB      TAB      Ectopic      Multiple      Live Births  2              Patient Active Problem List   Diagnosis Date Noted  . Status post laparoscopic hysterectomy 09/22/2018  . Family history of colon cancer in father 08/22/2018  . Menorrhagia with irregular cycle 07/10/2018  . Iron deficiency anemia due to chronic blood loss 07/10/2018  . Fibroid uterus   . Newly recognized heart murmur 01/08/2016  . Esophageal reflux 01/08/2016    Past Medical History:  Diagnosis Date  . Abnormal EKG 09/2012   2013 r/t anemia, Note: EKG 08/2018 WNL and Stress test 08/2018 Normal  . Anemia   . Chest pain    December, 2013- r/t anemia  . Dyspnea   . Fibroid uterus   . GERD (gastroesophageal reflux disease)    diet controlled, no meds  . SVD (spontaneous vaginal delivery)    x 2    Past Surgical History:  Procedure Laterality Date  . COLONOSCOPY    . CYSTOSCOPY N/A 09/22/2018   Procedure: CYSTOSCOPY;  Surgeon: Salvadore Dom, MD;  Location: [facility];  Service: Gynecology;  Laterality: N/A;  . NO PAST SURGERIES    . TOTAL LAPAROSCOPIC HYSTERECTOMY WITH SALPINGECTOMY Bilateral 09/22/2018   Procedure: TOTAL  LAPAROSCOPIC HYSTERECTOMY WITH SALPINGECTOMY;  Surgeon: Salvadore Dom, MD;  Location: [facility];  Service: Gynecology;  Laterality: Bilateral;  extended recovery bed  . UPPER GI ENDOSCOPY      Current Outpatient Medications  Medication Sig Dispense Refill  . docusate sodium (COLACE) 100 MG capsule Take 1 capsule (100 mg total) by mouth 2 (two) times daily. 30 capsule 0  . ibuprofen (ADVIL,MOTRIN) 800 MG tablet Take 1 tablet (800 mg total) by mouth every 8 (eight) hours as needed. 30 tablet 0  . traMADol (ULTRAM) 50 MG tablet Take 1 tablet (50 mg total) by mouth every 6 (six) hours as needed (mild pain). 25 tablet 0  . Multiple Vitamin (MULTIVITAMIN) tablet Take 1 tablet by mouth daily.    . Vitamin D, Ergocalciferol, (DRISDOL) 1.25 MG (50000 UT) CAPS capsule ONE CAPSULE BY MOUTH ONCE A WEEK FOR 8 WEEKS. THEN TAKE 1000IU/DAY (Patient not taking: One capsule by mouth once a week for 8 weeks. Then take 1000IU/day) 24 capsule 1   No current facility-administered medications for this visit.      ALLERGIES: Patient has no known allergies.  Family History  Problem Relation Age  of Onset  . Cirrhosis Mother   . Diabetes Mother   . Stroke Mother 61  . Osteoporosis Mother   . Heart disease Maternal Grandmother   . Colon cancer Father   . Diabetes Father   . Hypertension Father     Social History   Socioeconomic History  . Marital status: Single    Spouse name: n/a  . Number of children: 2  . Years of education: Master's  . Highest education level: Not on file  Occupational History  . Occupation: Patent attorney    Comment: [employer]  . Financial resource strain: Not on file  . Food insecurity:    Worry: Not on file    Inability: Not on file  . Transportation needs:    Medical: Not on file    Non-medical: Not on file  Tobacco Use  . Smoking status: Never Smoker  . Smokeless tobacco: Never Used  Substance and Sexual Activity  . Alcohol use: No     Alcohol/week: 0.0 standard drinks  . Drug use: No  . Sexual activity: Yes    Partners: Male    Birth control/protection: Condom  Lifestyle  . Physical activity:    Days per week: Not on file    Minutes per session: Not on file  . Stress: Not on file  Relationships  . Social connections:    Talks on phone: Not on file    Gets together: Not on file    Attends religious service: Not on file    Active member of club or organization: Not on file    Attends meetings of clubs or organizations: Not on file    Relationship status: Not on file  . Intimate partner violence:    Fear of current or ex partner: Not on file    Emotionally abused: Not on file    Physically abused: Not on file    Forced sexual activity: Not on file  Other Topics Concern  . Not on file  Social History Narrative   Lives with her oldest daughter and granddaughter.    Review of Systems  Constitutional: Negative.   HENT: Negative.   Eyes: Negative.   Respiratory: Negative.   Cardiovascular: Negative.   Gastrointestinal: Negative.   Genitourinary: Negative.   Musculoskeletal: Negative.   Skin: Negative.   Neurological: Negative.   Endo/Heme/Allergies: Negative.   Psychiatric/Behavioral: Negative.     PHYSICAL EXAMINATION:    BP 120/68 (BP Location: Right Arm, Patient Position: Sitting, Cuff Size: Normal)   Pulse 80   Resp 16   Ht 5\' 4"  (1.626 m)   Wt 150 lb (68 kg)   LMP 09/13/2018 (Exact Date)   BMI 25.75 kg/m     General appearance: alert, cooperative and appears stated age Abdomen: soft, non-tender; non distended, no masses,  no organomegaly Incisions: healing well    ASSESSMENT 1 week post op s/p TLH    PLAN F/U in 3 weeks Call with any concerns   An After Visit Summary was printed and given to the patient.

## 2018-09-30 LAB — CBC
Hematocrit: 28.6 % — ABNORMAL LOW (ref 34.0–46.6)
Hemoglobin: 9.6 g/dL — ABNORMAL LOW (ref 11.1–15.9)
MCH: 30.5 pg (ref 26.6–33.0)
MCHC: 33.6 g/dL (ref 31.5–35.7)
MCV: 91 fL (ref 79–97)
Platelets: 365 10*3/uL (ref 150–450)
RBC: 3.15 x10E6/uL — ABNORMAL LOW (ref 3.77–5.28)
RDW: 12.8 % (ref 12.3–15.4)
WBC: 8.9 10*3/uL (ref 3.4–10.8)

## 2018-10-03 ENCOUNTER — Inpatient Hospital Stay: Admission: RE | Admit: 2018-10-03 | Payer: BC Managed Care – PPO | Source: Ambulatory Visit

## 2018-10-04 ENCOUNTER — Other Ambulatory Visit: Payer: Self-pay | Admitting: Obstetrics and Gynecology

## 2018-10-04 DIAGNOSIS — D500 Iron deficiency anemia secondary to blood loss (chronic): Secondary | ICD-10-CM

## 2018-10-06 ENCOUNTER — Ambulatory Visit
Admission: RE | Admit: 2018-10-06 | Discharge: 2018-10-06 | Disposition: A | Discharge status: Home / Self Care | Payer: BC Managed Care – PPO | Source: Ambulatory Visit | Attending: Obstetrics and Gynecology | Admitting: Obstetrics and Gynecology

## 2018-10-06 DIAGNOSIS — N6320 Unspecified lump in the left breast, unspecified quadrant: Secondary | ICD-10-CM

## 2018-10-09 ENCOUNTER — Ambulatory Visit (HOSPITAL_COMMUNITY)
Admission: EM | Admit: 2018-10-09 | Discharge: 2018-10-09 | Disposition: A | Discharge status: Home / Self Care | Payer: BC Managed Care – PPO | Attending: Family Medicine | Admitting: Family Medicine

## 2018-10-09 ENCOUNTER — Other Ambulatory Visit: Payer: Self-pay

## 2018-10-09 ENCOUNTER — Telehealth: Payer: Self-pay | Admitting: Obstetrics and Gynecology

## 2018-10-09 ENCOUNTER — Encounter (HOSPITAL_COMMUNITY): Payer: Self-pay | Admitting: Emergency Medicine

## 2018-10-09 ENCOUNTER — Ambulatory Visit (INDEPENDENT_AMBULATORY_CARE_PROVIDER_SITE_OTHER): Payer: BC Managed Care – PPO

## 2018-10-09 DIAGNOSIS — R0602 Shortness of breath: Secondary | ICD-10-CM

## 2018-10-09 NOTE — ED Triage Notes (Signed)
Pt reports minor issues with breathing that started December 26.  She states she feels like she has to take real deep breaths to catch her breath intermittently.  She also states when lying down she can feel the same feeling.  She is 17 days post Hysterectomy and they recommended she come here for a chest xray.

## 2018-10-09 NOTE — Telephone Encounter (Signed)
Spoke with patient.  S/p Hysterectomy 09/22/18.  Reports intermittent feelings of SOB when laying down and after increased activity since 09/30/18, no problems after 12/24 until 12/31. No SOB when resting.  No chest pain.  No lower leg swelling or redness. States she feels well currently.  Advised needs to see PCP or urgent care as soon as possible.  Call back if any problems with obtaining care.  Recommended [facility] by [facility] in case needs further imaging.  Patient agreeable to plan for care and follow up. Understands needs eval as soon as possible as she is still post op. Routing to Dr. Talbert Nan.

## 2018-10-09 NOTE — ED Provider Notes (Signed)
[facility]    CSN: [id number] Arrival date & time: 10/09/18  1016     History   Chief Complaint Chief Complaint  Patient presents with  . Shortness of Breath    HPI Cathy Hernandez is a 48 y.o. female.   HPI  Patient had a laparoscopic hysterectomy performed on 09/22/2018.  She states that she went for a follow-up on 09/2309/2019 and was doing well.  On Christmas Eve she felt like she developed some shortness of breath.  No wheezing or difficulty breathing, but she states that her chest felt heavy and that she had trouble taking a deep breath.  She did have some sweats and chills but did not take her temperature.  She does not think it was a fever.  She is not coughing, no sputum, no chest pain or pressure.  She states symptoms of heaviness are worse at night.  Non-smoker.  No ongoing lung disease.  No problems with extremities  Past Medical History:  Diagnosis Date  . Abnormal EKG 09/2012   2013 r/t anemia, Note: EKG 08/2018 WNL and Stress test 08/2018 Normal  . Anemia   . Chest pain    December, 2013- r/t anemia  . Dyspnea   . Fibroid uterus   . GERD (gastroesophageal reflux disease)    diet controlled, no meds  . SVD (spontaneous vaginal delivery)    x 2    Patient Active Problem List   Diagnosis Date Noted  . Status post laparoscopic hysterectomy 09/22/2018  . Family history of colon cancer in father 08/22/2018  . Menorrhagia with irregular cycle 07/10/2018  . Iron deficiency anemia due to chronic blood loss 07/10/2018  . Fibroid uterus   . Newly recognized heart murmur 01/08/2016  . Esophageal reflux 01/08/2016    Past Surgical History:  Procedure Laterality Date  . COLONOSCOPY    . CYSTOSCOPY N/A 09/22/2018   Procedure: CYSTOSCOPY;  Surgeon: Salvadore Dom, MD;  Location: [facility];  Service: Gynecology;  Laterality: N/A;  . NO PAST SURGERIES    . TOTAL LAPAROSCOPIC HYSTERECTOMY WITH SALPINGECTOMY Bilateral 09/22/2018   Procedure: TOTAL  LAPAROSCOPIC HYSTERECTOMY WITH SALPINGECTOMY;  Surgeon: Salvadore Dom, MD;  Location: [facility];  Service: Gynecology;  Laterality: Bilateral;  extended recovery bed  . UPPER GI ENDOSCOPY      OB History    Gravida  4   Para  2   Term  2   Preterm      AB  2   Living  2     SAB      TAB      Ectopic      Multiple      Live Births  2            Home Medications    Prior to Admission medications   Medication Sig Start Date End Date Taking? Authorizing Provider  docusate sodium (COLACE) 100 MG capsule Take 1 capsule (100 mg total) by mouth 2 (two) times daily. 09/23/18  Yes Salvadore Dom, MD  ibuprofen (ADVIL,MOTRIN) 800 MG tablet Take 1 tablet (800 mg total) by mouth every 8 (eight) hours as needed. 09/23/18  Yes Salvadore Dom, MD  Multiple Vitamin (MULTIVITAMIN) tablet Take 1 tablet by mouth daily.   Yes [provider]  traMADol (ULTRAM) 50 MG tablet Take 1 tablet (50 mg total) by mouth every 6 (six) hours as needed (mild pain). 09/23/18  Yes Salvadore Dom, MD  Vitamin D, Ergocalciferol, (DRISDOL)  1.25 MG (50000 UT) CAPS capsule ONE CAPSULE BY MOUTH ONCE A WEEK FOR 8 WEEKS. THEN TAKE 1000IU/DAY 09/08/18  Yes Orma Flaming, MD    Family History Family History  Problem Relation Age of Onset  . Cirrhosis Mother   . Diabetes Mother   . Stroke Mother 91  . Osteoporosis Mother   . Heart disease Maternal Grandmother   . Colon cancer Father   . Diabetes Father   . Hypertension Father     Social History Social History   Tobacco Use  . Smoking status: Never Smoker  . Smokeless tobacco: Never Used  Substance Use Topics  . Alcohol use: No    Alcohol/week: 0.0 standard drinks  . Drug use: No     Allergies   Patient has no known allergies.   Review of Systems Review of Systems  Constitutional: Negative for chills and fever.  HENT: Negative for ear pain and sore throat.   Eyes: Negative for pain and visual disturbance.    Respiratory: Positive for chest tightness and shortness of breath. Negative for cough.   Cardiovascular: Negative for chest pain and palpitations.  Gastrointestinal: Negative for abdominal pain and vomiting.  Genitourinary: Negative for dysuria and hematuria.  Musculoskeletal: Negative for arthralgias and back pain.  Skin: Negative for color change and rash.  Neurological: Negative for seizures and syncope.  All other systems reviewed and are negative.    Physical Exam Triage Vital Signs ED Triage Vitals  Enc Vitals Group     BP 10/09/18 1138 116/71     Pulse Rate 10/09/18 1138 74     Resp --      Temp 10/09/18 1138 98.4 F (36.9 C)     Temp Source 10/09/18 1138 Oral     SpO2 10/09/18 1138 100 %   No data found.  Updated Vital Signs BP 116/71 (BP Location: Right Arm)   Pulse 74   Temp 98.4 F (36.9 C) (Oral)   LMP 09/23/2018   SpO2 100%      Physical Exam Constitutional:      General: She is not in acute distress.    Appearance: She is well-developed.  HENT:     Head: Normocephalic and atraumatic.  Eyes:     Conjunctiva/sclera: Conjunctivae normal.     Pupils: Pupils are equal, round, and reactive to light.  Neck:     Musculoskeletal: Normal range of motion.  Cardiovascular:     Rate and Rhythm: Normal rate and regular rhythm.     Heart sounds: Normal heart sounds.  Pulmonary:     Effort: Pulmonary effort is normal. No respiratory distress.     Breath sounds: Normal breath sounds.     Comments: Lungs clear throughout Chest:     Chest wall: No tenderness.  Abdominal:     General: There is no distension.     Palpations: Abdomen is soft.  Musculoskeletal: Normal range of motion.  Lymphadenopathy:     Cervical: No cervical adenopathy.  Skin:    General: Skin is warm and dry.  Neurological:     General: No focal deficit present.     Mental Status: She is alert.  Psychiatric:        Mood and Affect: Mood normal.      UC Treatments / Results   Labs (all labs ordered are listed, but only abnormal results are displayed) Labs Reviewed - No data to display  EKG None  Radiology Dg Chest 2 View  Result Date: 10/09/2018 CLINICAL  DATA:  Three-week history of shortness of breath, acutely worsened over the past 2 days. Nonsmoker. EXAM: CHEST - 2 VIEW COMPARISON:  01/08/2016, 09/16/2012. FINDINGS: Cardiomediastinal silhouette unremarkable and unchanged. Lungs clear. Bronchovascular markings normal. Pulmonary vascularity normal. No visible pleural effusions. No pneumothorax. Visualized bony thorax intact. IMPRESSION: No acute cardiopulmonary disease. Stable examination. Electronically Signed   By: Evangeline Dakin M.D.   On: 10/09/2018 12:19    Procedures Procedures (including critical care time)  Medications Ordered in UC Medications - No data to display  Initial Impression / Assessment and Plan / UC Course  I have reviewed the triage vital signs and the nursing notes.  Pertinent labs & imaging results that were available during my care of the patient were reviewed by me and considered in my medical decision making (see chart for details).     This does not seem to be an operative complication.  NO evidence for PE or anesthesia related complication   To see PCP if persists. Final Clinical Impressions(s) / UC Diagnoses   Final diagnoses:  SOB (shortness of breath)     Discharge Instructions     Continue the deep breathing exercises Exercise and walk to tolerance See your PCP if this feeling continues    ED Prescriptions    None     Controlled Substance Prescriptions [place] Controlled Substance Registry consulted? Not Applicable   Raylene Everts, MD 10/09/18 [phone]

## 2018-10-09 NOTE — Discharge Instructions (Signed)
Continue the deep breathing exercises Exercise and walk to tolerance See your PCP if this feeling continues

## 2018-10-09 NOTE — Telephone Encounter (Signed)
I called to check on the patient. She has had intermittent episodes of feeling she couldn't catch her breath. She was seen in urgent care, was told everything looked good. They did a chest xray, normal. Normal vitals and O2 sat. The note isn't finished in Epic, but the patient states she was feeling better. Felt she just needed to take deeper breaths. They told they didn't feel she had a blood clot (no chest CT was done).  She said she currently feels well. I advised her to call if she has recurring issues.

## 2018-10-09 NOTE — Telephone Encounter (Signed)
Patient had hysterectomy 09/22/18. States she has been having difficulty breathing. Has been having to take slow, deep breaths to catch her breath. It has happened a few times including 10/03/18 and 10/07/18. States it is better right now, but wanted to speak with a nurse to decide whether she needs to be seen here or go to urgent care.

## 2018-10-10 ENCOUNTER — Emergency Department (HOSPITAL_COMMUNITY)
Admission: EM | Admit: 2018-10-10 | Discharge: 2018-10-11 | Disposition: A | Discharge status: Home / Self Care | Payer: BC Managed Care – PPO | Attending: Emergency Medicine | Admitting: Emergency Medicine

## 2018-10-10 ENCOUNTER — Emergency Department (HOSPITAL_COMMUNITY): Payer: BC Managed Care – PPO

## 2018-10-10 ENCOUNTER — Other Ambulatory Visit: Payer: Self-pay

## 2018-10-10 DIAGNOSIS — R0602 Shortness of breath: Secondary | ICD-10-CM | POA: Diagnosis not present

## 2018-10-10 DIAGNOSIS — D649 Anemia, unspecified: Secondary | ICD-10-CM

## 2018-10-10 DIAGNOSIS — Z79899 Other long term (current) drug therapy: Secondary | ICD-10-CM | POA: Insufficient documentation

## 2018-10-10 DIAGNOSIS — R911 Solitary pulmonary nodule: Secondary | ICD-10-CM | POA: Diagnosis not present

## 2018-10-10 LAB — COMPREHENSIVE METABOLIC PANEL
ALBUMIN: 3.4 g/dL — AB (ref 3.5–5.0)
ALT: 17 U/L (ref 0–44)
AST: 19 U/L (ref 15–41)
Alkaline Phosphatase: 59 U/L (ref 38–126)
Anion gap: 11 (ref 5–15)
BUN: 12 mg/dL (ref 6–20)
CHLORIDE: 103 mmol/L (ref 98–111)
CO2: 24 mmol/L (ref 22–32)
Calcium: 9.5 mg/dL (ref 8.9–10.3)
Creatinine, Ser: 0.85 mg/dL (ref 0.44–1.00)
GFR calc Af Amer: >60 mL/min (ref >60)
GFR calc non Af Amer: >60 mL/min (ref >60)
Glucose, Bld: 147 mg/dL — ABNORMAL HIGH (ref 70–99)
Potassium: 3.6 mmol/L (ref 3.5–5.1)
Sodium: 138 mmol/L (ref 135–145)
Total Bilirubin: <0.1 mg/dL — ABNORMAL LOW (ref 0.3–1.2)
Total Protein: 7.9 g/dL (ref 6.5–8.1)

## 2018-10-10 LAB — CBC WITH DIFFERENTIAL/PLATELET
ABS IMMATURE GRANULOCYTES: 0.02 10*3/uL (ref 0.00–0.07)
Basophils Absolute: 0 10*3/uL (ref 0.0–0.1)
Basophils Relative: 0 %
Eosinophils Absolute: 0.3 10*3/uL (ref 0.0–0.5)
Eosinophils Relative: 5 %
HCT: 34.9 % — ABNORMAL LOW (ref 36.0–46.0)
Hemoglobin: 10.9 g/dL — ABNORMAL LOW (ref 12.0–15.0)
Immature Granulocytes: 0 %
Lymphocytes Relative: 30 %
Lymphs Abs: 2.1 10*3/uL (ref 0.7–4.0)
MCH: 27.7 pg (ref 26.0–34.0)
MCHC: 31.2 g/dL (ref 30.0–36.0)
MCV: 88.6 fL (ref 80.0–100.0)
Monocytes Absolute: 0.6 10*3/uL (ref 0.1–1.0)
Monocytes Relative: 9 %
NEUTROS ABS: 3.9 10*3/uL (ref 1.7–7.7)
Neutrophils Relative %: 56 %
Platelets: 385 10*3/uL (ref 150–400)
RBC: 3.94 MIL/uL (ref 3.87–5.11)
RDW: 13.2 % (ref 11.5–15.5)
WBC: 7 10*3/uL (ref 4.0–10.5)
nRBC: 0 % (ref 0.0–0.2)

## 2018-10-10 LAB — I-STAT TROPONIN, ED: Troponin i, poc: 0 ng/mL (ref 0.00–0.08)

## 2018-10-10 MED ORDER — IOPAMIDOL (ISOVUE-370) INJECTION 76%
INTRAVENOUS | Status: AC
  Filled 2018-10-10: qty 100

## 2018-10-10 MED ORDER — IOPAMIDOL (ISOVUE-370) INJECTION 76%
100.0000 mL | Freq: Once | INTRAVENOUS | Status: AC | PRN
  Administered 2018-10-10: 100 mL via INTRAVENOUS

## 2018-10-10 NOTE — ED Notes (Signed)
Patient transported to CT 

## 2018-10-10 NOTE — Telephone Encounter (Signed)
Spoke with patient.  She states she feels concerned that she still feels difficulty in taking a deep breath.  Not acutely SOB at this time, but feels she must work at taking a deep breath when laying down.  Discussed her GERD symptoms, she denies at this time.  Advised patient if still having symptoms. Needs to go to ED for evaluation. Patient agreeable to this. She states she will go now to ED.  Reviewed with Dr. Talbert Nan. ED disposition for SOB while laying down, ongoing, despite negative evaluation in urgent care yesterday.  Patient to call back with any further concerns.

## 2018-10-10 NOTE — Telephone Encounter (Signed)
Call to patient after calling Dr. Talbert Nan.  Advised of our concerns about not going to seek further care at ED as previously discussed.  Patient states she agrees and states she keeps "changing her mind" but knows she should go.  She states she is going to bring her Mother some food and go to closest ED for evaluation.  Advised any problems to call back, but that ED evaluation is recommended.  Pt agreeable.  Encounter to Dr. Talbert Nan and and will close.

## 2018-10-10 NOTE — Telephone Encounter (Signed)
Patient has decided to not go to the ED and will monitor how she is doing.

## 2018-10-10 NOTE — Telephone Encounter (Signed)
Patient still has some concerns she would like to discuss.

## 2018-10-10 NOTE — ED Triage Notes (Signed)
Pt here today sent here by her MD to r/o PE , pt has been worked up for SOB at the [facility]

## 2018-10-10 NOTE — ED Notes (Signed)
Pt. Called for room no answer.  

## 2018-10-10 NOTE — ED Provider Notes (Signed)
[facility] EMERGENCY DEPARTMENT Provider Note   CSN: [id number] Arrival date & time: 10/10/18  1517     History   Chief Complaint Chief Complaint  Patient presents with  . Shortness of Breath    HPI Waylynn Benefiel is a 48 y.o. female with a hx of anemia, fibroid uterus, GERD presents to the Emergency Department complaining of gradual, persistent, progressively worsening anterior chest pressure and shortness of breath onset approximately 1 week ago.  She reports that symptoms are worse when she lies flat or exerts herself.  She reports that particularly walking up a set of stairs makes her chest pressure and shortness of breath worse.  She also has associated palpitations.  Patient is status post hysterectomy 09/22/2018.  She reports no symptoms for the first week however she began to have shortness of breath just after Christmas which has worsened.  She denies fever, chills, headache, neck pain, abdominal pain, nausea, vomiting, diarrhea, weakness, dizziness, syncope.  She has no history of DVT.  She is not taking any estrogen.  She does report that most days of the last 2 weeks she has not gotten out of bed.  Initially this was due to her surgery but over the last week has specifically been due to her shortness of breath.  Patient reports she is sleeping propped up on pillows.  She does report some posterior right leg pain onset this morning but has not had any noticeable swelling of her bilateral lower extremities.  She reports occasional dry cough but no hemoptysis.  The history is provided by medical records. No language interpreter was used.    Past Medical History:  Diagnosis Date  . Abnormal EKG 09/2012   2013 r/t anemia, Note: EKG 08/2018 WNL and Stress test 08/2018 Normal  . Anemia   . Chest pain    December, 2013- r/t anemia  . Dyspnea   . Fibroid uterus   . GERD (gastroesophageal reflux disease)    diet controlled, no meds  . SVD (spontaneous vaginal  delivery)    x 2    Patient Active Problem List   Diagnosis Date Noted  . Status post laparoscopic hysterectomy 09/22/2018  . Family history of colon cancer in father 08/22/2018  . Menorrhagia with irregular cycle 07/10/2018  . Iron deficiency anemia due to chronic blood loss 07/10/2018  . Fibroid uterus   . Newly recognized heart murmur 01/08/2016  . Esophageal reflux 01/08/2016    Past Surgical History:  Procedure Laterality Date  . COLONOSCOPY    . CYSTOSCOPY N/A 09/22/2018   Procedure: CYSTOSCOPY;  Surgeon: Salvadore Dom, MD;  Location: [facility];  Service: Gynecology;  Laterality: N/A;  . NO PAST SURGERIES    . TOTAL LAPAROSCOPIC HYSTERECTOMY WITH SALPINGECTOMY Bilateral 09/22/2018   Procedure: TOTAL LAPAROSCOPIC HYSTERECTOMY WITH SALPINGECTOMY;  Surgeon: Salvadore Dom, MD;  Location: [facility];  Service: Gynecology;  Laterality: Bilateral;  extended recovery bed  . UPPER GI ENDOSCOPY       OB History    Gravida  4   Para  2   Term  2   Preterm      AB  2   Living  2     SAB      TAB      Ectopic      Multiple      Live Births  2            Home Medications    Prior to Admission medications  Medication Sig Start Date End Date Taking? Authorizing Provider  docusate sodium (COLACE) 100 MG capsule Take 1 capsule (100 mg total) by mouth 2 (two) times daily. 09/23/18   Salvadore Dom, MD  ibuprofen (ADVIL,MOTRIN) 800 MG tablet Take 1 tablet (800 mg total) by mouth every 8 (eight) hours as needed. 09/23/18   Salvadore Dom, MD  Multiple Vitamin (MULTIVITAMIN) tablet Take 1 tablet by mouth daily.    [provider]  traMADol (ULTRAM) 50 MG tablet Take 1 tablet (50 mg total) by mouth every 6 (six) hours as needed (mild pain). 09/23/18   Salvadore Dom, MD  Vitamin D, Ergocalciferol, (DRISDOL) 1.25 MG (50000 UT) CAPS capsule ONE CAPSULE BY MOUTH ONCE A WEEK FOR 8 WEEKS. THEN TAKE 1000IU/DAY 09/08/18   Orma Flaming, MD     Family History Family History  Problem Relation Age of Onset  . Cirrhosis Mother   . Diabetes Mother   . Stroke Mother 68  . Osteoporosis Mother   . Heart disease Maternal Grandmother   . Colon cancer Father   . Diabetes Father   . Hypertension Father     Social History Social History   Tobacco Use  . Smoking status: Never Smoker  . Smokeless tobacco: Never Used  Substance Use Topics  . Alcohol use: No    Alcohol/week: 0.0 standard drinks  . Drug use: No     Allergies   Patient has no known allergies.   Review of Systems Review of Systems  Constitutional: Negative for appetite change, diaphoresis, fatigue, fever and unexpected weight change.  HENT: Negative for mouth sores.   Eyes: Negative for visual disturbance.  Respiratory: Positive for shortness of breath. Negative for cough, chest tightness and wheezing.   Cardiovascular: Negative for chest pain.  Gastrointestinal: Negative for abdominal pain, constipation, diarrhea, nausea and vomiting.  Endocrine: Negative for polydipsia, polyphagia and polyuria.  Genitourinary: Negative for dysuria, frequency, hematuria and urgency.  Musculoskeletal: Negative for back pain and neck stiffness.  Skin: Negative for rash.  Allergic/Immunologic: Negative for immunocompromised state.  Neurological: Negative for syncope, light-headedness and headaches.  Hematological: Does not bruise/bleed easily.  Psychiatric/Behavioral: Negative for sleep disturbance. The patient is not nervous/anxious.      Physical Exam Updated Vital Signs BP 119/75   Pulse 77   Temp 98.6 F (37 C) (Oral)   Resp 16   LMP 09/23/2018   SpO2 100%   Physical Exam Vitals signs and nursing note reviewed.  Constitutional:      General: She is not in acute distress.    Appearance: She is well-developed. She is not diaphoretic.     Comments: Awake, alert, nontoxic appearance  HENT:     Head: Normocephalic and atraumatic.     Mouth/Throat:      Pharynx: No oropharyngeal exudate.  Eyes:     General: No scleral icterus.    Conjunctiva/sclera: Conjunctivae normal.  Neck:     Musculoskeletal: Normal range of motion and neck supple.  Cardiovascular:     Rate and Rhythm: Normal rate and regular rhythm.     Comments: No tachycardia. Pulmonary:     Effort: Pulmonary effort is normal. No respiratory distress.     Breath sounds: Normal breath sounds. No decreased breath sounds or wheezing.     Comments: Clear and equal breath sounds. Abdominal:     General: Bowel sounds are normal.     Palpations: Abdomen is soft. There is no mass.     Tenderness: There  is no abdominal tenderness. There is no guarding or rebound.  Musculoskeletal: Normal range of motion.     Right lower leg: No edema.     Comments: No peripheral edema.  No calf tenderness or posterior thigh tenderness.  Skin:    General: Skin is warm and dry.  Neurological:     Mental Status: She is alert.     Comments: Speech is clear and goal oriented Moves extremities without ataxia      ED Treatments / Results  Labs (all labs ordered are listed, but only abnormal results are displayed) Labs Reviewed  CBC WITH DIFFERENTIAL/PLATELET - Abnormal; Notable for the following components:      Result Value   Hemoglobin 10.9 (*)    HCT 34.9 (*)    All other components within normal limits  COMPREHENSIVE METABOLIC PANEL - Abnormal; Notable for the following components:   Glucose, Bld 147 (*)    Albumin 3.4 (*)    Total Bilirubin <0.1 (*)    All other components within normal limits  I-STAT TROPONIN, ED    EKG EKG Interpretation  Date/Time:  Friday October 10 2018 22:52:14 EST Ventricular Rate:  73 PR Interval:  124 QRS Duration: 82 QT Interval:  364 QTC Calculation: 401 R Axis:   49 Text Interpretation:  Normal sinus rhythm Nonspecific T wave abnormality Abnormal ECG When compared with ECG of 09/30/2012, No significant change was found Confirmed by Delora Fuel  ([id number]) on 10/10/2018 11:01:29 PM   Radiology Dg Chest 2 View  Result Date: 10/10/2018 CLINICAL DATA:  Chest pressure short of breath EXAM: CHEST - 2 VIEW COMPARISON:  10/09/2018 FINDINGS: The heart size and mediastinal contours are within normal limits. Both lungs are clear. The visualized skeletal structures are unremarkable. IMPRESSION: No active cardiopulmonary disease. Electronically Signed   By: Donavan Foil M.D.   On: 10/10/2018 22:51   Dg Chest 2 View  Result Date: 10/09/2018 CLINICAL DATA:  Three-week history of shortness of breath, acutely worsened over the past 2 days. Nonsmoker. EXAM: CHEST - 2 VIEW COMPARISON:  01/08/2016, 09/16/2012. FINDINGS: Cardiomediastinal silhouette unremarkable and unchanged. Lungs clear. Bronchovascular markings normal. Pulmonary vascularity normal. No visible pleural effusions. No pneumothorax. Visualized bony thorax intact. IMPRESSION: No acute cardiopulmonary disease. Stable examination. Electronically Signed   By: Evangeline Dakin M.D.   On: 10/09/2018 12:19   Ct Angio Chest Pe W And/or Wo Contrast  Result Date: 10/11/2018 CLINICAL DATA:  PE suspected, high pretest prob. Shortness of breath for 1 week. Hysterectomy 2 and half weeks ago. EXAM: CT ANGIOGRAPHY CHEST WITH CONTRAST TECHNIQUE: Multidetector CT imaging of the chest was performed using the standard protocol during bolus administration of intravenous contrast. Multiplanar CT image reconstructions and MIPs were obtained to evaluate the vascular anatomy. CONTRAST:  66 ML ISOVUE-370 IOPAMIDOL (ISOVUE-370) INJECTION 76% COMPARISON:  Radiographs earlier this day. FINDINGS: Cardiovascular: There are no filling defects within the pulmonary arteries to suggest pulmonary embolus. The thoracic aorta is normal in caliber without dissection. Heart is normal in size. No pericardial effusion. 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 No  mediastinal or hilar adenopathy. The esophagus is decompressed. No visualized thyroid nodule. Lungs/Pleura: No consolidation. No pulmonary edema or pleural fluid. Tiny perifissural nodule in the left lower lobe image 37 series 9. Trachea and mainstem bronchi are patent. Upper Abdomen: No acute findings. Musculoskeletal: There are no acute or suspicious osseous abnormalities. Review of the MIP images confirms the above findings.  IMPRESSION: 1. No pulmonary embolus or acute intrathoracic abnormality. 2. Tiny perifissural nodule in the left lower lobe. This is likely an intrapulmonary lymph node, however but nonspecific. No follow-up needed if patient is low-risk. Non-contrast chest CT can be considered in 12 months if patient is high-risk. This recommendation follows the consensus statement: Guidelines for Management of Incidental Pulmonary Nodules Detected on CT Images: From the Fleischner Society 2017; Radiology 2017; 284:228-243. Electronically Signed   By: Keith Rake M.D.   On: 10/11/2018 00:17    Procedures Procedures (including critical care time)  Medications Ordered in ED Medications  iopamidol (ISOVUE-370) 76 % injection (has no administration in time range)  iopamidol (ISOVUE-370) 76 % injection 100 mL (100 mLs Intravenous Contrast Given 10/10/18 2350)     Initial Impression / Assessment and Plan / ED Course  I have reviewed the triage vital signs and the nursing notes.  Pertinent labs & imaging results that were available during my care of the patient were reviewed by me and considered in my medical decision making (see chart for details).     Presents with shortness of breath status post hysterectomy.  Patient with increased risk for DVT/PE.  CT angiogram of the chest is without evidence of pulmonary embolism.  Nodule is noted and was discussed with patient.  She will have primary care follow-up for this.  Labs are reassuring.  Patient with hemoglobin of 10.9.  This is improved  since her surgery but may be a cause of her shortness of breath.  EKG, though not completely normal is unchanged from devious EKG.  Troponin is negative.  Chest x-ray is without evidence of pneumothorax, pulmonary edema, cardiomegaly or pneumonia.  No pneumonia seen on CT scan.  Unknown etiology of patient's shortness of breath.  Question deconditioning versus anemia.  She will need close follow-up with her primary care provider for further evaluation.  Discussed reasons to return immediately to the emergency department.  Patient states understanding and is in agreement with the plan.  Final Clinical Impressions(s) / ED Diagnoses   Final diagnoses:  SOB (shortness of breath)  Lung nodule  Anemia, unspecified type    ED Discharge Orders    None       Welford Christmas, Gwenlyn Perking 49/44/96 [id number]    Delora Fuel, MD 63/84/66 [phone]

## 2018-10-11 NOTE — Discharge Instructions (Addendum)
1. Medications: usual home medications 2. Treatment: rest, drink plenty of fluids, increase activity slowly doing slightly more each day to improve stamina 3. Follow Up: Please followup with your primary doctor in 2 days for discussion of your diagnoses and further evaluation after today's visit; if you do not have a primary care doctor use the resource guide provided to find one; Please return to the ER for worsening chest pain, worsening shortness of breath, leg swelling, syncope or other concerns.

## 2018-10-13 NOTE — Telephone Encounter (Signed)
The patient was seen on Friday in the ER for SOB, negative evaluation for PE. Mild anemia (as expected). No etiology for her SOB was found. Please call and check on her and make sure she is setting up a f/u visit with her primary MD.

## 2018-10-13 NOTE — Telephone Encounter (Signed)
Call to patient. Confirmed she is going to call PCP and schedule follow up.  Feels like her breathing is still "labored." But is resting and feels "okay".  Has post op appointment with Dr. Talbert Nan this week and will discuss with her and PCP.  Encounter to Dr Talbert Nan. Will close.

## 2018-10-14 NOTE — Progress Notes (Signed)
GYNECOLOGY  VISIT   HPI: 48 y.o.   Single Black or African American Not Hispanic or Latino  female   [phone] with Patient's last menstrual period was 09/23/2018 (lmp unknown).   here for 3.5 week post op s/p TLH/BS.  She had a large fibroid uterus, benign pathology.   Hgb got down to 8.1 post op, was up to 10.9 last week.  She has been having issues with SOB, started 9 days post op on 12/25, got better and returned on 10/07/18. She has had a negative evaluation for PE normal EKG. She saw her primary this morning who is going to evaluate her more, including an echocardiogram. Feels more short of breath when she lies down. Some SOB when going up stairs. She just doesn't feel normal. Abdomen feels good, no pain. Normal bowel and bladder function.    GYNECOLOGIC HISTORY: Patient's last menstrual period was 09/23/2018 (lmp unknown). Contraception: Hysterectomy Menopausal hormone therapy: None        OB History    Gravida  4   Para  2   Term  2   Preterm      AB  2   Living  2     SAB      TAB      Ectopic      Multiple      Live Births  2              Patient Active Problem List   Diagnosis Date Noted  . Status post laparoscopic hysterectomy 09/22/2018  . Family history of colon cancer in father 08/22/2018  . Menorrhagia with irregular cycle 07/10/2018  . Iron deficiency anemia due to chronic blood loss 07/10/2018  . Fibroid uterus   . Newly recognized heart murmur 01/08/2016  . Esophageal reflux 01/08/2016    Past Medical History:  Diagnosis Date  . Abnormal EKG 09/2012   2013 r/t anemia, Note: EKG 08/2018 WNL and Stress test 08/2018 Normal  . Anemia   . Chest pain    December, 2013- r/t anemia  . Dyspnea   . Fibroid uterus   . GERD (gastroesophageal reflux disease)    diet controlled, no meds  . SVD (spontaneous vaginal delivery)    x 2    Past Surgical History:  Procedure Laterality Date  . COLONOSCOPY    . CYSTOSCOPY N/A 09/22/2018   Procedure:  CYSTOSCOPY;  Surgeon: Salvadore Dom, MD;  Location: [facility];  Service: Gynecology;  Laterality: N/A;  . NO PAST SURGERIES    . TOTAL LAPAROSCOPIC HYSTERECTOMY WITH SALPINGECTOMY Bilateral 09/22/2018   Procedure: TOTAL LAPAROSCOPIC HYSTERECTOMY WITH SALPINGECTOMY;  Surgeon: Salvadore Dom, MD;  Location: [facility];  Service: Gynecology;  Laterality: Bilateral;  extended recovery bed  . UPPER GI ENDOSCOPY      Current Outpatient Medications  Medication Sig Dispense Refill  . IRON PO Take by mouth.    . Multiple Vitamin (MULTIVITAMIN) tablet Take 1 tablet by mouth daily.    . Vitamin D, Ergocalciferol, (DRISDOL) 1.25 MG (50000 UT) CAPS capsule ONE CAPSULE BY MOUTH ONCE A WEEK FOR 8 WEEKS. THEN TAKE 1000IU/DAY 24 capsule 1   No current facility-administered medications for this visit.      ALLERGIES: Patient has no known allergies.  Family History  Problem Relation Age of Onset  . Cirrhosis Mother   . Diabetes Mother   . Stroke Mother 38  . Osteoporosis Mother   . Heart disease Maternal Grandmother   . Colon cancer  Father   . Diabetes Father   . Hypertension Father     Social History   Socioeconomic History  . Marital status: Single    Spouse name: n/a  . Number of children: 2  . Years of education: Master's  . Highest education level: Not on file  Occupational History  . Occupation: Patent attorney    Comment: [employer]  . Financial resource strain: Not on file  . Food insecurity:    Worry: Not on file    Inability: Not on file  . Transportation needs:    Medical: Not on file    Non-medical: Not on file  Tobacco Use  . Smoking status: Never Smoker  . Smokeless tobacco: Never Used  Substance and Sexual Activity  . Alcohol use: No    Alcohol/week: 0.0 standard drinks  . Drug use: No  . Sexual activity: Yes    Partners: Male    Birth control/protection: Condom  Lifestyle  . Physical activity:    Days per week: Not on file    Minutes  per session: Not on file  . Stress: Not on file  Relationships  . Social connections:    Talks on phone: Not on file    Gets together: Not on file    Attends religious service: Not on file    Active member of club or organization: Not on file    Attends meetings of clubs or organizations: Not on file    Relationship status: Not on file  . Intimate partner violence:    Fear of current or ex partner: Not on file    Emotionally abused: Not on file    Physically abused: Not on file    Forced sexual activity: Not on file  Other Topics Concern  . Not on file  Social History Narrative   Lives with her oldest daughter and granddaughter.    Review of Systems  Constitutional: Negative.   HENT: Negative.   Eyes: Negative.   Respiratory: Positive for shortness of breath.   Cardiovascular: Negative.   Gastrointestinal: Negative.   Genitourinary: Negative.   Musculoskeletal: Negative.   Skin: Negative.   Neurological: Negative.   Endo/Heme/Allergies: Negative.   Psychiatric/Behavioral: Negative.     PHYSICAL EXAMINATION:    BP 126/78 (BP Location: Right Arm, Patient Position: Sitting, Cuff Size: Normal)   Pulse 72   Wt 150 lb 12.8 oz (68.4 kg)   LMP 09/23/2018 (LMP Unknown)   BMI 25.88 kg/m     General appearance: alert, cooperative and appears stated age Abdomen: soft, non-tender; non distended, no masses,  no organomegaly Incisions: healing well  Pelvic: External genitalia:  no lesions              Urethra:  normal appearing urethra with no masses, tenderness or lesions              Bartholins and Skenes: normal                 Vagina: normal appearing vagina with normal color and discharge, no lesions  Vaginal cuff: healing well              Cervix: absent              Bimanual Exam:  Uterus:  uterus absent              Adnexa: no mass, fullness, tenderness  Chaperone was present for exam.  ASSESSMENT 3.5 weeks post op s/p TLH/BS, doing well, normal  bowel/bladder function, no pain. Healing well She has been having on going issues with SOB since 9 days post op. She has had negative lab work, negative w/u for PE, normal EKG    PLAN She is being further evaluated by Dr Rogers Blocker for her SOB F/U the week of 1/20, will hold on going back to work until then   An After Visit Summary was printed and given to the patient.

## 2018-10-16 ENCOUNTER — Encounter: Payer: Self-pay | Admitting: Obstetrics and Gynecology

## 2018-10-16 ENCOUNTER — Ambulatory Visit (INDEPENDENT_AMBULATORY_CARE_PROVIDER_SITE_OTHER): Payer: BC Managed Care – PPO | Admitting: Obstetrics and Gynecology

## 2018-10-16 ENCOUNTER — Ambulatory Visit: Payer: BC Managed Care – PPO | Admitting: Family Medicine

## 2018-10-16 ENCOUNTER — Other Ambulatory Visit: Payer: Self-pay

## 2018-10-16 ENCOUNTER — Encounter: Payer: Self-pay | Admitting: Family Medicine

## 2018-10-16 VITALS — BP 116/86 | HR 71 | Temp 98.5°F | Ht 64.0 in | Wt 151.2 lb

## 2018-10-16 VITALS — BP 126/78 | HR 72 | Wt 150.8 lb

## 2018-10-16 DIAGNOSIS — R0602 Shortness of breath: Secondary | ICD-10-CM | POA: Diagnosis not present

## 2018-10-16 DIAGNOSIS — Z90710 Acquired absence of both cervix and uterus: Secondary | ICD-10-CM

## 2018-10-16 LAB — LIPASE: Lipase: 18 U/L (ref 11.0–59.0)

## 2018-10-16 LAB — CBC WITH DIFFERENTIAL/PLATELET
Basophils Absolute: 0 10*3/uL (ref 0.0–0.1)
Basophils Relative: 0.6 % (ref 0.0–3.0)
Eosinophils Absolute: 0.2 10*3/uL (ref 0.0–0.7)
Eosinophils Relative: 4.8 % (ref 0.0–5.0)
HCT: 33.3 % — ABNORMAL LOW (ref 36.0–46.0)
HEMOGLOBIN: 11 g/dL — AB (ref 12.0–15.0)
LYMPHS PCT: 26.4 % (ref 12.0–46.0)
Lymphs Abs: 1.2 10*3/uL (ref 0.7–4.0)
MCHC: 32.9 g/dL (ref 30.0–36.0)
MCV: 87.9 fl (ref 78.0–100.0)
Monocytes Absolute: 0.3 10*3/uL (ref 0.1–1.0)
Monocytes Relative: 7.4 % (ref 3.0–12.0)
Neutro Abs: 2.8 10*3/uL (ref 1.4–7.7)
Neutrophils Relative %: 60.8 % (ref 43.0–77.0)
Platelets: 327 10*3/uL (ref 150.0–400.0)
RBC: 3.79 Mil/uL — ABNORMAL LOW (ref 3.87–5.11)
RDW: 14.8 % (ref 11.5–15.5)
WBC: 4.6 10*3/uL (ref 4.0–10.5)

## 2018-10-16 LAB — BRAIN NATRIURETIC PEPTIDE: Pro B Natriuretic peptide (BNP): 89 pg/mL (ref 0.0–100.0)

## 2018-10-16 LAB — TSH: TSH: 2.11 u[IU]/mL (ref 0.35–4.50)

## 2018-10-16 NOTE — Progress Notes (Signed)
Patient: Cathy Hernandez MRN: [id number] DOB: 08-28-1971 PCP: Orma Flaming, MD     Subjective:  Chief Complaint  Patient presents with  . Shortness of Breath    on and off x 2 wks    HPI: The patient is a 48 y.o. female who presents today for shortness of breath after 2 visits to the ER on 1/2 and 1/3. Seen at urgent care on 1/2 and no abnormal findings. At ER on 10/10/18 ekg, labs, CXR and CT all normal with no evidence of PE or changes on ekg. Anemia at 10.9 and improved from previous cbc. She was sent home and is following up here today. I had her have a pre-op stress test which was normal as well. She had a bad night last night and had to sit up 90 degrees in the bed it's not as bad, but if she lays flat it really exacerbates her symptoms. She feels like she needs to take deep breaths, doesn't feel like she is gasping for breath. She feels pressure on her chest as well. No swelling in her legs, rashes, orthopnea, gasping for air. No congestion/cough or URI symptoms. Had iron infusion in hospital, but no other abnormal hospital course following hysterectomy.   Review of Systems  Constitutional: Negative for fatigue and fever.  HENT: Negative for congestion, rhinorrhea, sinus pressure, sinus pain and sore throat.   Eyes: Negative for visual disturbance.  Respiratory: Positive for shortness of breath. Negative for cough, chest tightness and wheezing.   Cardiovascular: Positive for chest pain. Negative for palpitations and leg swelling.       Feels a pressure like sensation in chest that radiates to back also.  Worse when lying down.  Gastrointestinal: Negative for abdominal pain and nausea.  Musculoskeletal: Positive for back pain.  Neurological: Negative for dizziness and headaches.    Allergies Patient has No Known Allergies.  Past Medical History Patient  has a past medical history of Abnormal EKG (09/2012), Anemia, Chest pain, Dyspnea, Fibroid uterus, GERD (gastroesophageal reflux  disease), and SVD (spontaneous vaginal delivery).  Surgical History Patient  has a past surgical history that includes No past surgeries; Upper gi endoscopy; Colonoscopy; Total laparoscopic hysterectomy with salpingectomy (Bilateral, 09/22/2018); and Cystoscopy (N/A, 09/22/2018).  Family History Pateint's family history includes Cirrhosis in her mother; Colon cancer in her father; Diabetes in her father and mother; Heart disease in her maternal grandmother; Hypertension in her father; Osteoporosis in her mother; Stroke (age of onset: 22) in her mother.  Social History Patient  reports that she has never smoked. She has never used smokeless tobacco. She reports that she does not drink alcohol or use drugs.    Objective: Vitals:   10/16/18 0836  BP: 116/86  Pulse: 71  Temp: 98.5 F (36.9 C)  TempSrc: Oral  SpO2: 100%  Weight: 151 lb 3.2 oz (68.6 kg)  Height: 5\' 4"  (1.626 m)    Body mass index is 25.95 kg/m.  Physical Exam Vitals signs reviewed.  Constitutional:      Appearance: She is well-developed.  Eyes:     Extraocular Movements: Extraocular movements intact.     Pupils: Pupils are equal, round, and reactive to light.  Neck:     Musculoskeletal: Normal range of motion and neck supple.  Cardiovascular:     Rate and Rhythm: Normal rate and regular rhythm.  Pulmonary:     Effort: Pulmonary effort is normal. No tachypnea or respiratory distress.     Breath sounds: No decreased breath sounds,  wheezing or rales.  Chest:     Chest wall: No tenderness.  Abdominal:     General: Bowel sounds are normal.     Palpations: Abdomen is soft.  Musculoskeletal:     Right lower leg: No edema.     Left lower leg: No edema.  Skin:    General: Skin is warm.  Neurological:     General: No focal deficit present.     Mental Status: She is alert and oriented to person, place, and time.  Psychiatric:        Mood and Affect: Mood normal.        Behavior: Behavior normal.         Assessment/plan: 1. Shortness of breath Reviewed all of her ER records/labs/imaging. No abnormal findings except a likely incidental nodule/lymph node on CT. She is low risk. Discussed she doesn't really need followup, but she is going to think on this. Would need ct in 12 months.   As for her symptoms of shortness of breath/pressure on her chest when lying flat, I have no idea. Discussed her stress test was negative and she isn't really short of breath, it's more a feeling. Will check BNP, lipase and cbc/thyroid. Will go ahead and order echo to make sure no CHF, although extremely low differential. No cough, orthopnea, fluid and no fluid in lungs on CT. Also will look for pulmonary htn. Really want her to use spirometer in the meantime. If echo/labs normal and symptoms persist, would suggest sending to pulm. She is okay with the plan.  - Brain natriuretic peptide - Lipase - CBC with Differential/Platelet - TSH - ECHOCARDIOGRAM COMPLETE; Future     Return if symptoms worsen or fail to improve.    Orma Flaming, MD [place]   10/16/2018

## 2018-10-16 NOTE — Patient Instructions (Signed)
Labs and echo.. if all normal then consider pulm referral..Marland Kitchen

## 2018-10-20 ENCOUNTER — Other Ambulatory Visit (HOSPITAL_COMMUNITY): Payer: BC Managed Care – PPO

## 2018-10-21 ENCOUNTER — Ambulatory Visit (HOSPITAL_COMMUNITY): Payer: BC Managed Care – PPO | Attending: Cardiology

## 2018-10-21 ENCOUNTER — Other Ambulatory Visit: Payer: Self-pay

## 2018-10-21 DIAGNOSIS — R0602 Shortness of breath: Secondary | ICD-10-CM | POA: Insufficient documentation

## 2018-10-23 ENCOUNTER — Ambulatory Visit: Payer: BC Managed Care – PPO | Admitting: Pulmonary Disease

## 2018-10-23 ENCOUNTER — Encounter: Payer: Self-pay | Admitting: Pulmonary Disease

## 2018-10-23 DIAGNOSIS — K219 Gastro-esophageal reflux disease without esophagitis: Secondary | ICD-10-CM

## 2018-10-23 DIAGNOSIS — R0600 Dyspnea, unspecified: Secondary | ICD-10-CM

## 2018-10-23 NOTE — Assessment & Plan Note (Signed)
No pulmonary problems identified. CT angiogram negative for pulmonary bothersome, no reason to consider asthma and spirometry not necessary.  Does not seem to be a cardiac issue She will call us if needed, symptoms seem to be improved now

## 2018-10-23 NOTE — Progress Notes (Signed)
Subjective:    Patient ID: Cathy Hernandez, female    DOB: 05/22/1971, 48 y.o.   MRN: [id number]  HPI  Chief Complaint  Patient presents with  . Pulm Consult    Self referral for post hysterectomy labored breathing. Had a hysterectomy a month ago, noticed the SOB a week later. Has been unable to sleep on back to SOB.     48 year old never smoker, principal from middle school, presents for evaluation of labored breathing postop. She underwent an elective hysterectomy on 12/16 for a fibroid uterus.  She developed shortness of breath starting right after Christmas which would be worse at night.  She describes this as a chest tightness in the epigastrium going up to her chest and back not clear dyspnea.  This would be worse at night when she would lie down.  She does not clearly describe dyspnea on walking.  There is no chest pain, or wheezing. Hemoglobin dropped as low as 8.1 postoperatively and has recovered to Levaquin. She had no complications of surgery otherwise. She had an emergency room visit on 1/3 her troponins were negative EKG was normal, CT angiogram was performed which I personally reviewed which did not show any infiltrates or effusions or any evidence of pulmonary emboli.  Esophagus looked normal to my review.  She reports that she has taken Tums on 2 occasions in the last few days and this has relieved her symptoms.  She reports remote EGD and colonoscopy which were normal  There is no childhood history of asthma. She denies sputum production or URI/allergies       Past Medical History:  Diagnosis Date  . Abnormal EKG 09/2012   2013 r/t anemia, Note: EKG 08/2018 WNL and Stress test 08/2018 Normal  . Anemia   . Chest pain    December, 2013- r/t anemia  . Dyspnea   . Fibroid uterus   . GERD (gastroesophageal reflux disease)    diet controlled, no meds  . SVD (spontaneous vaginal delivery)    x 2     Past Surgical History:  Procedure Laterality Date  .  COLONOSCOPY    . CYSTOSCOPY N/A 09/22/2018   Procedure: CYSTOSCOPY;  Surgeon: Salvadore Dom, MD;  Location: [facility];  Service: Gynecology;  Laterality: N/A;  . NO PAST SURGERIES    . TOTAL LAPAROSCOPIC HYSTERECTOMY WITH SALPINGECTOMY Bilateral 09/22/2018   Procedure: TOTAL LAPAROSCOPIC HYSTERECTOMY WITH SALPINGECTOMY;  Surgeon: Salvadore Dom, MD;  Location: [facility];  Service: Gynecology;  Laterality: Bilateral;  extended recovery bed  . UPPER GI ENDOSCOPY      No Known Allergies   Social History   Socioeconomic History  . Marital status: Single    Spouse name: n/a  . Number of children: 2  . Years of education: Master's  . Highest education level: Not on file  Occupational History  . Occupation: Patent attorney    Comment: [employer]  . Financial resource strain: Not on file  . Food insecurity:    Worry: Not on file    Inability: Not on file  . Transportation needs:    Medical: Not on file    Non-medical: Not on file  Tobacco Use  . Smoking status: Never Smoker  . Smokeless tobacco: Never Used  Substance and Sexual Activity  . Alcohol use: No    Alcohol/week: 0.0 standard drinks  . Drug use: No  . Sexual activity: Yes    Partners: Male    Birth control/protection: Condom  Lifestyle  . Physical activity:    Days per week: Not on file    Minutes per session: Not on file  . Stress: Not on file  Relationships  . Social connections:    Talks on phone: Not on file    Gets together: Not on file    Attends religious service: Not on file    Active member of club or organization: Not on file    Attends meetings of clubs or organizations: Not on file    Relationship status: Not on file  . Intimate partner violence:    Fear of current or ex partner: Not on file    Emotionally abused: Not on file    Physically abused: Not on file    Forced sexual activity: Not on file  Other Topics Concern  . Not on file  Social History Narrative   Lives  with her oldest daughter and granddaughter.     Family History  Problem Relation Age of Onset  . Cirrhosis Mother   . Diabetes Mother   . Stroke Mother 70  . Osteoporosis Mother   . Heart disease Maternal Grandmother   . Colon cancer Father   . Diabetes Father   . Hypertension Father      Review of Systems  Constitutional: Negative for fever and unexpected weight change.  HENT: Negative for congestion, dental problem, ear pain, nosebleeds, postnasal drip, rhinorrhea, sinus pressure, sneezing, sore throat and trouble swallowing.   Eyes: Negative for redness and itching.  Respiratory: Positive for shortness of breath. Negative for cough, chest tightness and wheezing.   Cardiovascular: Negative for palpitations and leg swelling.  Gastrointestinal: Negative for nausea and vomiting.  Genitourinary: Negative for dysuria.  Musculoskeletal: Negative for joint swelling.  Skin: Negative for rash.  Allergic/Immunologic: Negative.  Negative for environmental allergies, food allergies and immunocompromised state.  Neurological: Negative for headaches.  Hematological: Does not bruise/bleed easily.  Psychiatric/Behavioral: Negative for dysphoric mood. The patient is not nervous/anxious.        Objective:   Physical Exam  Gen. Pleasant, well-nourished, in no distress, normal affect ENT - no pallor,icterus, no post nasal drip Neck: No JVD, no thyromegaly, no carotid bruits Lungs: no use of accessory muscles, no dullness to percussion, clear without rales or rhonchi  Cardiovascular: Rhythm regular, heart sounds  normal, no murmurs or gallops, no peripheral edema Abdomen: soft and non-tender, no hepatosplenomegaly, BS normal. Musculoskeletal: No deformities, no cyanosis or clubbing Neuro:  alert, non focal       Assessment & Plan:

## 2018-10-23 NOTE — Patient Instructions (Signed)
No pulmonary problems identified. Okay to take Pepcid 20 mg daily for 2 to 4 weeks for reflux. We discussed other simple measures.  Call us as needed

## 2018-10-23 NOTE — Assessment & Plan Note (Signed)
Okay to take Pepcid 20 mg daily for 2 to 4 weeks for reflux. We discussed other simple measures.  Call us as needed

## 2018-10-27 NOTE — Progress Notes (Signed)
GYNECOLOGY  VISIT   HPI: 48 y.o.   Single Black or African American Not Hispanic or Latino  female   [phone] with Patient's last menstrual period was 09/23/2018 (lmp unknown).   here for post operative visit to follow up on SOB. She is feeling so much better. She started tums, helped her sleep. She saw a Pulmonologist, he looked at her w/u and thought her evaluation was negative. He told her to take Pepcid.  GYNECOLOGIC HISTORY: Patient's last menstrual period was 09/23/2018 (lmp unknown). Contraception: Hysterectomy Menopausal hormone therapy: None        OB History    Gravida  4   Para  2   Term  2   Preterm      AB  2   Living  2     SAB      TAB      Ectopic      Multiple      Live Births  2              Patient Active Problem List   Diagnosis Date Noted  . Status post laparoscopic hysterectomy 09/22/2018  . Family history of colon cancer in father 08/22/2018  . Menorrhagia with irregular cycle 07/10/2018  . Iron deficiency anemia due to chronic blood loss 07/10/2018  . Fibroid uterus   . Newly recognized heart murmur 01/08/2016  . Esophageal reflux 01/08/2016  . Dyspnea     Past Medical History:  Diagnosis Date  . Abnormal EKG 09/2012   2013 r/t anemia, Note: EKG 08/2018 WNL and Stress test 08/2018 Normal  . Anemia   . Chest pain    December, 2013- r/t anemia  . Dyspnea   . Fibroid uterus   . GERD (gastroesophageal reflux disease)    diet controlled, no meds  . SVD (spontaneous vaginal delivery)    x 2    Past Surgical History:  Procedure Laterality Date  . COLONOSCOPY    . CYSTOSCOPY N/A 09/22/2018   Procedure: CYSTOSCOPY;  Surgeon: Salvadore Dom, MD;  Location: [facility];  Service: Gynecology;  Laterality: N/A;  . NO PAST SURGERIES    . TOTAL LAPAROSCOPIC HYSTERECTOMY WITH SALPINGECTOMY Bilateral 09/22/2018   Procedure: TOTAL LAPAROSCOPIC HYSTERECTOMY WITH SALPINGECTOMY;  Surgeon: Salvadore Dom, MD;  Location: [facility];   Service: Gynecology;  Laterality: Bilateral;  extended recovery bed  . UPPER GI ENDOSCOPY      Current Outpatient Medications  Medication Sig Dispense Refill  . famotidine (PEPCID) 10 MG tablet Take 10 mg by mouth 2 (two) times daily.    . IRON PO Take by mouth.    . Multiple Vitamin (MULTIVITAMIN) tablet Take 1 tablet by mouth daily.    . Vitamin D, Ergocalciferol, (DRISDOL) 1.25 MG (50000 UT) CAPS capsule ONE CAPSULE BY MOUTH ONCE A WEEK FOR 8 WEEKS. THEN TAKE 1000IU/DAY 24 capsule 1   No current facility-administered medications for this visit.      ALLERGIES: Patient has no known allergies.  Family History  Problem Relation Age of Onset  . Cirrhosis Mother   . Diabetes Mother   . Stroke Mother 38  . Osteoporosis Mother   . Heart disease Maternal Grandmother   . Colon cancer Father   . Diabetes Father   . Hypertension Father     Social History   Socioeconomic History  . Marital status: Single    Spouse name: n/a  . Number of children: 2  . Years of education: Master's  .  Highest education level: Not on file  Occupational History  . Occupation: Patent attorney    Comment: [employer]  . Financial resource strain: Not on file  . Food insecurity:    Worry: Not on file    Inability: Not on file  . Transportation needs:    Medical: Not on file    Non-medical: Not on file  Tobacco Use  . Smoking status: Never Smoker  . Smokeless tobacco: Never Used  Substance and Sexual Activity  . Alcohol use: No    Alcohol/week: 0.0 standard drinks  . Drug use: No  . Sexual activity: Yes    Partners: Male    Birth control/protection: Surgical  Lifestyle  . Physical activity:    Days per week: Not on file    Minutes per session: Not on file  . Stress: Not on file  Relationships  . Social connections:    Talks on phone: Not on file    Gets together: Not on file    Attends religious service: Not on file    Active member of club or organization: Not on  file    Attends meetings of clubs or organizations: Not on file    Relationship status: Not on file  . Intimate partner violence:    Fear of current or ex partner: Not on file    Emotionally abused: Not on file    Physically abused: Not on file    Forced sexual activity: Not on file  Other Topics Concern  . Not on file  Social History Narrative   Lives with her oldest daughter and granddaughter.    Review of Systems  Constitutional: Negative.   HENT: Negative.   Eyes: Negative.   Respiratory: Positive for shortness of breath.   Cardiovascular: Negative.   Gastrointestinal:       Reflux  Genitourinary: Negative.   Musculoskeletal: Negative.   Skin: Negative.   Neurological: Negative.   Endo/Heme/Allergies: Negative.   Psychiatric/Behavioral: Negative.     PHYSICAL EXAMINATION:    BP 120/78 (BP Location: Right Arm, Patient Position: Sitting, Cuff Size: Normal)   Pulse 76   Wt 148 lb 12.8 oz (67.5 kg)   LMP 09/23/2018 (LMP Unknown)   BMI 25.54 kg/m     General appearance: alert, cooperative and appears stated age Lungs: clear bilaterally Abdomen: soft, non-tender; non distended, no masses,  no organomegaly Incisions: well healed    ASSESSMENT 5 weeks post op s/p TLH. She had some issues with post op SOB, negative w/u. Now on medication for reflux and feeling better.     PLAN Routine f/u Can return to work   An After Visit Summary was printed and given to the patient.

## 2018-10-28 ENCOUNTER — Other Ambulatory Visit: Payer: Self-pay

## 2018-10-28 ENCOUNTER — Ambulatory Visit (INDEPENDENT_AMBULATORY_CARE_PROVIDER_SITE_OTHER): Payer: BC Managed Care – PPO | Admitting: Obstetrics and Gynecology

## 2018-10-28 ENCOUNTER — Encounter: Payer: Self-pay | Admitting: Obstetrics and Gynecology

## 2018-10-28 VITALS — BP 120/78 | HR 76 | Wt 148.8 lb

## 2018-10-28 DIAGNOSIS — Z90710 Acquired absence of both cervix and uterus: Secondary | ICD-10-CM

## 2019-05-07 ENCOUNTER — Other Ambulatory Visit: Payer: Self-pay

## 2019-05-07 NOTE — Progress Notes (Signed)
48 y.o. [id number] married Black or [place] American Not Hispanic or Latino female here for annual exam.  S/P TLH/BS in 12/19. No vaginal bleeding.  She just got married a few weeks ago. One of her daughters got married recently as well. No dyspareunia.     Patient's last menstrual period was 09/23/2018 (lmp unknown).          Sexually active: Yes.    The current method of family planning is status post hysterectomy.    Exercising: No.  The patient does not participate in regular exercise at present. Smoker:  no  Health Maintenance: Pap:  06/26/2018 WNL, 04-11-16 WNL NEG HR HPV History of abnormal Pap:  Yes 20+ yrs ago, cryosurgery MMG:  10/06/2018 Birads 2 benign Colonoscopy:  05-09-16 diverticulosis, otherwise normal. Due for f/u in 5 years (+FH) BMD:   Never TDaP:  06-10-15 Gardasil: N/A    reports that she has never smoked. She has never used smokeless tobacco. She reports that she does not drink alcohol or use drugs. She is a middle school Music therapist. 2 grown daughters.   Past Medical History:  Diagnosis Date  . Abnormal EKG 09/2012   2013 r/t anemia, Note: EKG 08/2018 WNL and Stress test 08/2018 Normal  . Anemia   . Chest pain    December, 2013- r/t anemia  . Dyspnea   . Fibroid uterus   . GERD (gastroesophageal reflux disease)    diet controlled, no meds  . SVD (spontaneous vaginal delivery)    x 2    Past Surgical History:  Procedure Laterality Date  . COLONOSCOPY    . CYSTOSCOPY N/A 09/22/2018   Procedure: CYSTOSCOPY;  Surgeon: Salvadore Dom, MD;  Location: [facility];  Service: Gynecology;  Laterality: N/A;  . NO PAST SURGERIES    . TOTAL LAPAROSCOPIC HYSTERECTOMY WITH SALPINGECTOMY Bilateral 09/22/2018   Procedure: TOTAL LAPAROSCOPIC HYSTERECTOMY WITH SALPINGECTOMY;  Surgeon: Salvadore Dom, MD;  Location: [facility];  Service: Gynecology;  Laterality: Bilateral;  extended recovery bed  . UPPER GI ENDOSCOPY      Current Outpatient Medications  Medication Sig  Dispense Refill  . famotidine (PEPCID) 10 MG tablet Take 10 mg by mouth 2 (two) times daily.    . IRON PO Take by mouth.    . Multiple Vitamin (MULTIVITAMIN) tablet Take 1 tablet by mouth daily.    . Vitamin D, Ergocalciferol, (DRISDOL) 1.25 MG (50000 UT) CAPS capsule ONE CAPSULE BY MOUTH ONCE A WEEK FOR 8 WEEKS. THEN TAKE 1000IU/DAY (Patient not taking: Reported on 05/11/2019) 24 capsule 1   No current facility-administered medications for this visit.     Family History  Problem Relation Age of Onset  . Cirrhosis Mother   . Diabetes Mother   . Stroke Mother 29  . Osteoporosis Mother   . Heart disease Maternal Grandmother   . Colon cancer Father   . Diabetes Father   . Hypertension Father     Review of Systems  Constitutional: Negative.   HENT: Negative.   Eyes: Negative.   Respiratory: Negative.   Cardiovascular: Negative.   Gastrointestinal: Negative.   Endocrine: Negative.   Genitourinary: Negative.   Musculoskeletal: Negative.   Skin: Negative.   Allergic/Immunologic: Negative.   Neurological: Negative.   Hematological: Negative.   Psychiatric/Behavioral: Negative.     Exam:   BP 126/80 (BP Location: Right Arm, Patient Position: Sitting, Cuff Size: Normal)   Pulse 64   Temp 98 F (36.7 C) (Skin)   Ht 5'  3.39" (1.61 m)   Wt 153 lb 9.6 oz (69.7 kg)   LMP 09/23/2018 (LMP Unknown)   BMI 26.88 kg/m   Weight change: @WEIGHTCHANGE @ Height:   Height: 5' 3.39" (161 cm)  Ht Readings from Last 3 Encounters:  05/11/19 5' 3.39" (1.61 m)  10/23/18 5\' 4"  (1.626 m)  10/16/18 5\' 4"  (1.626 m)    General appearance: alert, cooperative and appears stated age Head: Normocephalic, without obvious abnormality, atraumatic Neck: no adenopathy, supple, symmetrical, trachea midline and thyroid normal to inspection and palpation Lungs: clear to auscultation bilaterally Cardiovascular: regular rate and rhythm Breasts: normal appearance, no masses or tenderness Abdomen: soft,  non-tender; non distended,  no masses,  no organomegaly Extremities: extremities normal, atraumatic, no cyanosis or edema Skin: Skin color, texture, turgor normal. No rashes or lesions Lymph nodes: Cervical, supraclavicular, and axillary nodes normal. No abnormal inguinal nodes palpated Neurologic: Grossly normal   Pelvic: External genitalia:  no lesions              Urethra:  normal appearing urethra with no masses, tenderness or lesions              Bartholins and Skenes: normal                 Vagina: normal appearing vagina with normal color and discharge, no lesions              Cervix: absent               Bimanual Exam:  Uterus:  uterus absent              Adnexa: no mass, fullness, tenderness               Rectovaginal: Confirms               Anus:  normal sphincter tone, no lesions  Chaperone was present for exam.  A:  Well Woman with normal exam  S/P TLH/BS in 12/19 for a symptomatic fibroid uterus  H/O Anemia  P:   Pap every 5 years  Mammogram later this year  Colonoscopy q 5 years, UTD  Discussed breast self exam  Discussed calcium and vit D intake  CBC and ferritin  Labs with primary

## 2019-05-11 ENCOUNTER — Other Ambulatory Visit: Payer: Self-pay

## 2019-05-11 ENCOUNTER — Ambulatory Visit: Payer: BC Managed Care – PPO | Admitting: Obstetrics and Gynecology

## 2019-05-11 ENCOUNTER — Encounter: Payer: Self-pay | Admitting: Obstetrics and Gynecology

## 2019-05-11 VITALS — BP 126/80 | HR 64 | Temp 98.0°F | Ht 63.39 in | Wt 153.6 lb

## 2019-05-11 DIAGNOSIS — Z01419 Encounter for gynecological examination (general) (routine) without abnormal findings: Secondary | ICD-10-CM | POA: Diagnosis not present

## 2019-05-11 DIAGNOSIS — Z800 Family history of malignant neoplasm of digestive organs: Secondary | ICD-10-CM

## 2019-05-11 DIAGNOSIS — Z90710 Acquired absence of both cervix and uterus: Secondary | ICD-10-CM

## 2019-05-11 DIAGNOSIS — Z862 Personal history of diseases of the blood and blood-forming organs and certain disorders involving the immune mechanism: Secondary | ICD-10-CM | POA: Diagnosis not present

## 2019-05-11 NOTE — Patient Instructions (Signed)
EXERCISE AND DIET:  We recommended that you start or continue a regular exercise program for good health. Regular exercise means any activity that makes your heart beat faster and makes you sweat.  We recommend exercising at least 30 minutes per day at least 3 days a week, preferably 4 or 5.  We also recommend a diet low in fat and sugar.  Inactivity, poor dietary choices and obesity can cause diabetes, heart attack, stroke, and kidney damage, among others.    ALCOHOL AND SMOKING:  Women should limit their alcohol intake to no more than 7 drinks/beers/glasses of wine (combined, not each!) per week. Moderation of alcohol intake to this level decreases your risk of breast cancer and liver damage. And of course, no recreational drugs are part of a healthy lifestyle.  And absolutely no smoking or even second hand smoke. Most people know smoking can cause heart and lung diseases, but did you know it also contributes to weakening of your bones? Aging of your skin?  Yellowing of your teeth and nails?  CALCIUM AND VITAMIN D:  Adequate intake of calcium and Vitamin D are recommended.  The recommendations for exact amounts of these supplements seem to change often, but generally speaking 1,000 mg of calcium (between diet and supplement) and 800 units of Vitamin D per day seems prudent. Certain women may benefit from higher intake of Vitamin D.  If you are among these women, your doctor will have told you during your visit.    PAP SMEARS:  Pap smears, to check for cervical cancer or precancers,  have traditionally been done yearly, although recent scientific advances have shown that most women can have pap smears less often.  However, every woman still should have a physical exam from her gynecologist every year. It will include a breast check, inspection of the vulva and vagina to check for abnormal growths or skin changes, a visual exam of the cervix, and then an exam to evaluate the size and shape of the uterus and  ovaries.  And after 47 years of age, a rectal exam is indicated to check for rectal cancers. We will also provide age appropriate advice regarding health maintenance, like when you should have certain vaccines, screening for sexually transmitted diseases, bone density testing, colonoscopy, mammograms, etc.   MAMMOGRAMS:  All women over 40 years old should have a yearly mammogram. Many facilities now offer a "3D" mammogram, which may cost around $50 extra out of pocket. If possible,  we recommend you accept the option to have the 3D mammogram performed.  It both reduces the number of women who will be called back for extra views which then turn out to be normal, and it is better than the routine mammogram at detecting truly abnormal areas.    COLON CANCER SCREENING: Now recommend starting at age 45. At this time colonoscopy is not covered for routine screening until 50. There are take home tests that can be done between 45-49.   COLONOSCOPY:  Colonoscopy to screen for colon cancer is recommended for all women at age 50.  We know, you hate the idea of the prep.  We agree, BUT, having colon cancer and not knowing it is worse!!  Colon cancer so often starts as a polyp that can be seen and removed at colonscopy, which can quite literally save your life!  And if your first colonoscopy is normal and you have no family history of colon cancer, most women don't have to have it again for   10 years.  Once every ten years, you can do something that may end up saving your life, right?  We will be happy to help you get it scheduled when you are ready.  Be sure to check your insurance coverage so you understand how much it will cost.  It may be covered as a preventative service at no cost, but you should check your particular policy.      Breast Self-Awareness Breast self-awareness means being familiar with how your breasts look and feel. It involves checking your breasts regularly and reporting any changes to your  health care provider. Practicing breast self-awareness is important. A change in your breasts can be a sign of a serious medical problem. Being familiar with how your breasts look and feel allows you to find any problems early, when treatment is more likely to be successful. All women should practice breast self-awareness, including women who have had breast implants. How to do a breast self-exam One way to learn what is normal for your breasts and whether your breasts are changing is to do a breast self-exam. To do a breast self-exam: Look for Changes  1. Remove all the clothing above your waist. 2. Stand in front of a mirror in a room with good lighting. 3. Put your hands on your hips. 4. Push your hands firmly downward. 5. Compare your breasts in the mirror. Look for differences between them (asymmetry), such as: ? Differences in shape. ? Differences in size. ? Puckers, dips, and bumps in one breast and not the other. 6. Look at each breast for changes in your skin, such as: ? Redness. ? Scaly areas. 7. Look for changes in your nipples, such as: ? Discharge. ? Bleeding. ? Dimpling. ? Redness. ? A change in position. Feel for Changes Carefully feel your breasts for lumps and changes. It is best to do this while lying on your back on the floor and again while sitting or standing in the shower or tub with soapy water on your skin. Feel each breast in the following way:  Place the arm on the side of the breast you are examining above your head.  Feel your breast with the other hand.  Start in the nipple area and make  inch (2 cm) overlapping circles to feel your breast. Use the pads of your three middle fingers to do this. Apply light pressure, then medium pressure, then firm pressure. The light pressure will allow you to feel the tissue closest to the skin. The medium pressure will allow you to feel the tissue that is a little deeper. The firm pressure will allow you to feel the tissue  close to the ribs.  Continue the overlapping circles, moving downward over the breast until you feel your ribs below your breast.  Move one finger-width toward the center of the body. Continue to use the  inch (2 cm) overlapping circles to feel your breast as you move slowly up toward your collarbone.  Continue the up and down exam using all three pressures until you reach your armpit.  Write Down What You Find  Write down what is normal for each breast and any changes that you find. Keep a written record with breast changes or normal findings for each breast. By writing this information down, you do not need to depend only on memory for size, tenderness, or location. Write down where you are in your menstrual cycle, if you are still menstruating. If you are having trouble noticing differences   in your breasts, do not get discouraged. With time you will become more familiar with the variations in your breasts and more comfortable with the exam. How often should I examine my breasts? Examine your breasts every month. If you are breastfeeding, the best time to examine your breasts is after a feeding or after using a breast pump. If you menstruate, the best time to examine your breasts is 5-7 days after your period is over. During your period, your breasts are lumpier, and it may be more difficult to notice changes. When should I see my health care provider? See your health care provider if you notice:  A change in shape or size of your breasts or nipples.  A change in the skin of your breast or nipples, such as a reddened or scaly area.  Unusual discharge from your nipples.  A lump or thick area that was not there before.  Pain in your breasts.  Anything that concerns you.  

## 2019-05-12 LAB — CBC
Hematocrit: 40.8 % (ref 34.0–46.6)
Hemoglobin: 13.6 g/dL (ref 11.1–15.9)
MCH: 30.2 pg (ref 26.6–33.0)
MCHC: 33.3 g/dL (ref 31.5–35.7)
MCV: 91 fL (ref 79–97)
Platelets: 244 10*3/uL (ref 150–450)
RBC: 4.5 x10E6/uL (ref 3.77–5.28)
RDW: 13.1 % (ref 11.7–15.4)
WBC: 4.9 10*3/uL (ref 3.4–10.8)

## 2019-05-12 LAB — FERRITIN: Ferritin: 39 ng/mL (ref 15–150)

## 2019-08-19 ENCOUNTER — Telehealth: Payer: Self-pay | Admitting: Obstetrics and Gynecology

## 2019-08-19 NOTE — Telephone Encounter (Signed)
Spoke with pt. Pt wanting to know what flu vaccine to receive this year. Pt denies egg allergy. Will get flu vaccine at local [facility]. Pt agreeable and thankful for information.  Will close encounter.

## 2019-08-19 NOTE — Telephone Encounter (Signed)
Patient states she received a flu shot in the hospital last year during surgery and would like to know what kind she received to ensure she gets the same one this year (?) Please advise.

## 2020-05-12 ENCOUNTER — Encounter: Payer: Self-pay | Admitting: Obstetrics and Gynecology

## 2020-05-12 ENCOUNTER — Ambulatory Visit: Payer: BC Managed Care – PPO | Admitting: Obstetrics and Gynecology

## 2020-05-12 ENCOUNTER — Other Ambulatory Visit: Payer: Self-pay

## 2020-05-12 VITALS — BP 124/68 | Ht 63.75 in | Wt 159.0 lb

## 2020-05-12 DIAGNOSIS — Z90710 Acquired absence of both cervix and uterus: Secondary | ICD-10-CM

## 2020-05-12 DIAGNOSIS — Z0000 Encounter for general adult medical examination without abnormal findings: Secondary | ICD-10-CM

## 2020-05-12 DIAGNOSIS — Z800 Family history of malignant neoplasm of digestive organs: Secondary | ICD-10-CM

## 2020-05-12 DIAGNOSIS — Z833 Family history of diabetes mellitus: Secondary | ICD-10-CM

## 2020-05-12 DIAGNOSIS — Z01419 Encounter for gynecological examination (general) (routine) without abnormal findings: Secondary | ICD-10-CM

## 2020-05-12 DIAGNOSIS — Z8639 Personal history of other endocrine, nutritional and metabolic disease: Secondary | ICD-10-CM

## 2020-05-12 NOTE — Patient Instructions (Signed)
EXERCISE AND DIET:  We recommended that you start or continue a regular exercise program for good health. Regular exercise means any activity that makes your heart beat faster and makes you sweat.  We recommend exercising at least 30 minutes per day at least 3 days a week, preferably 4 or 5.  We also recommend a diet low in fat and sugar.  Inactivity, poor dietary choices and obesity can cause diabetes, heart attack, stroke, and kidney damage, among others.    ALCOHOL AND SMOKING:  Women should limit their alcohol intake to no more than 7 drinks/beers/glasses of wine (combined, not each!) per week. Moderation of alcohol intake to this level decreases your risk of breast cancer and liver damage. And of course, no recreational drugs are part of a healthy lifestyle.  And absolutely no smoking or even second hand smoke. Most people know smoking can cause heart and lung diseases, but did you know it also contributes to weakening of your bones? Aging of your skin?  Yellowing of your teeth and nails?  CALCIUM AND VITAMIN D:  Adequate intake of calcium and Vitamin D are recommended.  The recommendations for exact amounts of these supplements seem to change often, but generally speaking 1,200 mg of calcium (between diet and supplement) and 800 units of Vitamin D per day seems prudent. Certain women may benefit from higher intake of Vitamin D.  If you are among these women, your doctor will have told you during your visit.    PAP SMEARS:  Pap smears, to check for cervical cancer or precancers,  have traditionally been done yearly, although recent scientific advances have shown that most women can have pap smears less often.  However, every woman still should have a physical exam from her gynecologist every year. It will include a breast check, inspection of the vulva and vagina to check for abnormal growths or skin changes, a visual exam of the cervix, and then an exam to evaluate the size and shape of the uterus and  ovaries.  And after 48 years of age, a rectal exam is indicated to check for rectal cancers. We will also provide age appropriate advice regarding health maintenance, like when you should have certain vaccines, screening for sexually transmitted diseases, bone density testing, colonoscopy, mammograms, etc.   MAMMOGRAMS:  All women over 40 years old should have a yearly mammogram. Many facilities now offer a "3D" mammogram, which may cost around $50 extra out of pocket. If possible,  we recommend you accept the option to have the 3D mammogram performed.  It both reduces the number of women who will be called back for extra views which then turn out to be normal, and it is better than the routine mammogram at detecting truly abnormal areas.    COLON CANCER SCREENING: Now recommend starting at age 45. At this time colonoscopy is not covered for routine screening until 50. There are take home tests that can be done between 45-49.   COLONOSCOPY:  Colonoscopy to screen for colon cancer is recommended for all women at age 50.  We know, you hate the idea of the prep.  We agree, BUT, having colon cancer and not knowing it is worse!!  Colon cancer so often starts as a polyp that can be seen and removed at colonscopy, which can quite literally save your life!  And if your first colonoscopy is normal and you have no family history of colon cancer, most women don't have to have it again for   10 years.  Once every ten years, you can do something that may end up saving your life, right?  We will be happy to help you get it scheduled when you are ready.  Be sure to check your insurance coverage so you understand how much it will cost.  It may be covered as a preventative service at no cost, but you should check your particular policy.      Breast Self-Awareness Breast self-awareness means being familiar with how your breasts look and feel. It involves checking your breasts regularly and reporting any changes to your  health care provider. Practicing breast self-awareness is important. A change in your breasts can be a sign of a serious medical problem. Being familiar with how your breasts look and feel allows you to find any problems early, when treatment is more likely to be successful. All women should practice breast self-awareness, including women who have had breast implants. How to do a breast self-exam One way to learn what is normal for your breasts and whether your breasts are changing is to do a breast self-exam. To do a breast self-exam: Look for Changes  1. Remove all the clothing above your waist. 2. Stand in front of a mirror in a room with good lighting. 3. Put your hands on your hips. 4. Push your hands firmly downward. 5. Compare your breasts in the mirror. Look for differences between them (asymmetry), such as: ? Differences in shape. ? Differences in size. ? Puckers, dips, and bumps in one breast and not the other. 6. Look at each breast for changes in your skin, such as: ? Redness. ? Scaly areas. 7. Look for changes in your nipples, such as: ? Discharge. ? Bleeding. ? Dimpling. ? Redness. ? A change in position. Feel for Changes Carefully feel your breasts for lumps and changes. It is best to do this while lying on your back on the floor and again while sitting or standing in the shower or tub with soapy water on your skin. Feel each breast in the following way:  Place the arm on the side of the breast you are examining above your head.  Feel your breast with the other hand.  Start in the nipple area and make  inch (2 cm) overlapping circles to feel your breast. Use the pads of your three middle fingers to do this. Apply light pressure, then medium pressure, then firm pressure. The light pressure will allow you to feel the tissue closest to the skin. The medium pressure will allow you to feel the tissue that is a little deeper. The firm pressure will allow you to feel the tissue  close to the ribs.  Continue the overlapping circles, moving downward over the breast until you feel your ribs below your breast.  Move one finger-width toward the center of the body. Continue to use the  inch (2 cm) overlapping circles to feel your breast as you move slowly up toward your collarbone.  Continue the up and down exam using all three pressures until you reach your armpit.  Write Down What You Find  Write down what is normal for each breast and any changes that you find. Keep a written record with breast changes or normal findings for each breast. By writing this information down, you do not need to depend only on memory for size, tenderness, or location. Write down where you are in your menstrual cycle, if you are still menstruating. If you are having trouble noticing differences   in your breasts, do not get discouraged. With time you will become more familiar with the variations in your breasts and more comfortable with the exam. How often should I examine my breasts? Examine your breasts every month. If you are breastfeeding, the best time to examine your breasts is after a feeding or after using a breast pump. If you menstruate, the best time to examine your breasts is 5-7 days after your period is over. During your period, your breasts are lumpier, and it may be more difficult to notice changes. When should I see my health care provider? See your health care provider if you notice:  A change in shape or size of your breasts or nipples.  A change in the skin of your breast or nipples, such as a reddened or scaly area.  Unusual discharge from your nipples.  A lump or thick area that was not there before.  Pain in your breasts.  Anything that concerns you.  Perimenopause  Perimenopause is the normal time of life before and after menstrual periods stop completely (menopause). Perimenopause can begin 2-8 years before menopause, and it usually lasts for 1 year after  menopause. During perimenopause, the ovaries may or may not produce an egg. What are the causes? This condition is caused by a natural change in hormone levels that happens as you get older. What increases the risk? This condition is more likely to start at an earlier age if you have certain medical conditions or treatments, including:  A tumor of the pituitary gland in the brain.  A disease that affects the ovaries and hormone production.  Radiation treatment for cancer.  Certain cancer treatments, such as chemotherapy or hormone (anti-estrogen) therapy.  Heavy smoking and excessive alcohol use.  Family history of early menopause. What are the signs or symptoms? Perimenopausal changes affect each woman differently. Symptoms of this condition may include:  Hot flashes.  Night sweats.  Irregular menstrual periods.  Decreased sex drive.  Vaginal dryness.  Headaches.  Mood swings.  Depression.  Memory problems or trouble concentrating.  Irritability.  Tiredness.  Weight gain.  Anxiety.  Trouble getting pregnant. How is this diagnosed? This condition is diagnosed based on your medical history, a physical exam, your age, your menstrual history, and your symptoms. Hormone tests may also be done. How is this treated? In some cases, no treatment is needed. You and your health care provider should make a decision together about whether treatment is necessary. Treatment will be based on your individual condition and preferences. Various treatments are available, such as:  Menopausal hormone therapy (MHT).  Medicines to treat specific symptoms.  Acupuncture.  Vitamin or herbal supplements. Before starting treatment, make sure to let your health care provider know if you have a personal or family history of:  Heart disease.  Breast cancer.  Blood clots.  Diabetes.  Osteoporosis. Follow these instructions at home: Lifestyle  Do not use any products that  contain nicotine or tobacco, such as cigarettes and e-cigarettes. If you need help quitting, ask your health care provider.  Eat a balanced diet that includes fresh fruits and vegetables, whole grains, soybeans, eggs, lean meat, and low-fat dairy.  Get at least 30 minutes of physical activity on 5 or more days each week.  Avoid alcoholic and caffeinated beverages, as well as spicy foods. This may help prevent hot flashes.  Get 7-8 hours of sleep each night.  Dress in layers that can be removed to help you manage hot flashes.  Find ways to manage stress, such as deep breathing, meditation, or journaling. General instructions  Keep track of your menstrual periods, including: ? When they occur. ? How heavy they are and how long they last. ? How much time passes between periods.  Keep track of your symptoms, noting when they start, how often you have them, and how long they last.  Take over-the-counter and prescription medicines only as told by your health care provider.  Take vitamin supplements only as told by your health care provider. These may include calcium, vitamin E, and vitamin D.  Use vaginal lubricants or moisturizers to help with vaginal dryness and improve comfort during sex.  Talk with your health care provider before starting any herbal supplements.  Keep all follow-up visits as told by your health care provider. This is important. This includes any group therapy or counseling. Contact a health care provider if:  You have heavy vaginal bleeding or pass blood clots.  Your period lasts more than 2 days longer than normal.  Your periods are recurring sooner than 21 days.  You bleed after having sex. Get help right away if:  You have chest pain, trouble breathing, or trouble talking.  You have severe depression.  You have pain when you urinate.  You have severe headaches.  You have vision problems. Summary  Perimenopause is the time when a woman's body  begins to move into menopause. This may happen naturally or as a result of other health problems or medical treatments.  Perimenopause can begin 2-8 years before menopause, and it usually lasts for 1 year after menopause.  Perimenopausal symptoms can be managed through medicines, lifestyle changes, and complementary therapies such as acupuncture. This information is not intended to replace advice given to you by your health care provider. Make sure you discuss any questions you have with your health care provider. Document Revised: 09/06/2017 Document Reviewed: 10/30/2016 Elsevier Patient Education  2020 [employer].

## 2020-05-12 NOTE — Progress Notes (Signed)
49 y.o. [id number] Married Black or [place] American Not Hispanic or Latino female here for annual exam. H/o hysterectomy in 12/19 (TLH/BS). She would like some information about menopause.   She has started having mild night sweats, tolerable. No hot flashes. No vaginal dryness. Needs it cooler at night to sleep. Sleeps okay. Up 1-2 x a night to void.  No dyspareunia. Normal bowel and bladder function.     Patient's last menstrual period was 09/23/2018 (lmp unknown).          Sexually active: Yes.    The current method of family planning is status post hysterectomy.    Exercising: No.  The patient does not participate in regular exercise at present. Smoker:  no  Health Maintenance: Pap:  06/26/2018 WNL, 04-11-16 WNL NEG HR HPV History of abnormal Pap:  Yes20+ yrs ago, cryosurgery MMG:10/06/2018 Birads 2 benign   BMD:   Never  Colonoscopy: 05-09-16 diverticulosis, otherwise normal. Due for f/u in 5 years (+FH) TDaP:  06/10/15 Gardasil: NA    reports that she has never smoked. She has never used smokeless tobacco. She reports that she does not drink alcohol and does not use drugs. 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 2 grown daughters, granddaughter in 6th grade.   Past Medical History:  Diagnosis Date  . Abnormal EKG 09/2012   2013 r/t anemia, Note: EKG 08/2018 WNL and Stress test 08/2018 Normal  . Anemia   . Chest pain    December, 2013- r/t anemia  . Dyspnea   . Fibroid uterus   . GERD (gastroesophageal reflux disease)    diet controlled, no meds  . SVD (spontaneous vaginal delivery)    x 2    Past Surgical History:  Procedure Laterality Date  . COLONOSCOPY    . CYSTOSCOPY N/A 09/22/2018   Procedure: CYSTOSCOPY;  Surgeon: Salvadore Dom, MD;  Location: [facility];  Service: Gynecology;  Laterality: N/A;  . NO PAST SURGERIES    . TOTAL LAPAROSCOPIC HYSTERECTOMY WITH SALPINGECTOMY Bilateral 09/22/2018    Procedure: TOTAL LAPAROSCOPIC HYSTERECTOMY WITH SALPINGECTOMY;  Surgeon: Salvadore Dom, MD;  Location: [facility];  Service: Gynecology;  Laterality: Bilateral;  extended recovery bed  . UPPER GI ENDOSCOPY      No current outpatient medications on file.   No current facility-administered medications for this visit.    Family History  Problem Relation Age of Onset  . Cirrhosis Mother   . Diabetes Mother   . Stroke Mother 32  . Osteoporosis Mother   . Heart disease Maternal Grandmother   . Colon cancer Father   . Diabetes Father   . Hypertension Father     Review of Systems  All other systems reviewed and are negative.   Exam:   BP 124/68   Ht 5' 3.75" (1.619 m)   Wt 159 lb (72.1 kg)   LMP 09/23/2018 (LMP Unknown)   BMI 27.51 kg/m   Weight change: @WEIGHTCHANGE @ Height:   Height: 5' 3.75" (161.9 cm)  Ht Readings from Last 3 Encounters:  05/12/20 5' 3.75" (1.619 m)  05/11/19 5' 3.39" (1.61 m)  10/23/18 5\' 4"  (1.626 m)    General appearance: alert, cooperative and appears stated age Head: Normocephalic, without obvious abnormality, atraumatic Neck: no adenopathy, supple, symmetrical, trachea midline and thyroid normal to inspection and palpation Lungs: clear to auscultation bilaterally Cardiovascular: regular rate and rhythm Breasts: normal appearance, no masses or tenderness Abdomen: soft,  non-tender; non distended,  no masses,  no organomegaly Extremities: extremities normal, atraumatic, no cyanosis or edema Skin: Skin color, texture, turgor normal. No rashes or lesions Lymph nodes: Cervical, supraclavicular, and axillary nodes normal. No abnormal inguinal nodes palpated Neurologic: Grossly normal   Pelvic: External genitalia:  no lesions              Urethra:  normal appearing urethra with no masses, tenderness or lesions              Bartholins and Skenes: normal                 Vagina: well estrogenized appearing vagina with normal color and discharge, no  lesions              Cervix: absent               Bimanual Exam:  Uterus:  uterus absent              Adnexa: no mass, fullness, tenderness               Rectovaginal: Confirms               Anus:  normal sphincter tone, no lesions  Gae Dry chaperoned for the exam.  A:  Well Woman with normal exam  H/O TLH/BS  FH colon cancer  Mild night sweats, discussed perimenopause  Vit d def  FH DM  P:   Pap q 5 years  Mammogram overdue  Colonoscopy UTD  Labs today  Discussed breast self exam  Discussed calcium and vit D intake

## 2020-05-13 LAB — COMPREHENSIVE METABOLIC PANEL
ALT: 11 IU/L (ref 0–32)
AST: 11 IU/L (ref 0–40)
Albumin/Globulin Ratio: 1.4 (ref 1.2–2.2)
Albumin: 4.4 g/dL (ref 3.8–4.8)
Alkaline Phosphatase: 82 IU/L (ref 48–121)
BUN/Creatinine Ratio: 20 (ref 9–23)
BUN: 15 mg/dL (ref 6–24)
Bilirubin Total: <0.2 mg/dL (ref 0.0–1.2)
CO2: 27 mmol/L (ref 20–29)
Calcium: 9.4 mg/dL (ref 8.7–10.2)
Chloride: 100 mmol/L (ref 96–106)
Creatinine, Ser: 0.74 mg/dL (ref 0.57–1.00)
GFR calc Af Amer: 111 mL/min/{1.73_m2} (ref >59)
GFR calc non Af Amer: 96 mL/min/{1.73_m2} (ref >59)
Globulin, Total: 3.1 g/dL (ref 1.5–4.5)
Glucose: 71 mg/dL (ref 65–99)
Potassium: 4 mmol/L (ref 3.5–5.2)
Sodium: 140 mmol/L (ref 134–144)
Total Protein: 7.5 g/dL (ref 6.0–8.5)

## 2020-05-13 LAB — CBC
Hematocrit: 40.6 % (ref 34.0–46.6)
Hemoglobin: 13.1 g/dL (ref 11.1–15.9)
MCH: 28.7 pg (ref 26.6–33.0)
MCHC: 32.3 g/dL (ref 31.5–35.7)
MCV: 89 fL (ref 79–97)
Platelets: 258 10*3/uL (ref 150–450)
RBC: 4.56 x10E6/uL (ref 3.77–5.28)
RDW: 13 % (ref 11.7–15.4)
WBC: 6.6 10*3/uL (ref 3.4–10.8)

## 2020-05-13 LAB — LIPID PANEL
Chol/HDL Ratio: 2.7 ratio (ref 0.0–4.4)
Cholesterol, Total: 157 mg/dL (ref 100–199)
HDL: 58 mg/dL (ref >39)
LDL Chol Calc (NIH): 80 mg/dL (ref 0–99)
Triglycerides: 108 mg/dL (ref 0–149)
VLDL Cholesterol Cal: 19 mg/dL (ref 5–40)

## 2020-05-13 LAB — HEMOGLOBIN A1C
Est. average glucose Bld gHb Est-mCnc: 114 mg/dL
Hgb A1c MFr Bld: 5.6 % (ref 4.8–5.6)

## 2020-05-13 LAB — VITAMIN D 25 HYDROXY (VIT D DEFICIENCY, FRACTURES): Vit D, 25-Hydroxy: 17.8 ng/mL — ABNORMAL LOW (ref 30.0–100.0)

## 2020-05-16 ENCOUNTER — Other Ambulatory Visit: Payer: Self-pay

## 2020-05-16 DIAGNOSIS — Z8639 Personal history of other endocrine, nutritional and metabolic disease: Secondary | ICD-10-CM

## 2020-05-16 NOTE — Progress Notes (Signed)
Vitamin D order placed for 3 month labs.

## 2020-05-17 ENCOUNTER — Telehealth: Payer: Self-pay

## 2020-05-17 NOTE — Telephone Encounter (Signed)
Patient has called back in regard?  °

## 2020-05-17 NOTE — Telephone Encounter (Signed)
Pt went to gynecologist last week and had her vitamin D checked. Those results came back low. She is asking if she should do a prescription of Vitamin D then do OTC Vitamin D like Dr. Rogers Blocker instructed last time.

## 2020-05-17 NOTE — Telephone Encounter (Signed)
I spoke with pt to make her aware that she is being followed by GYN for Vitamin D. I told her that she is to pick up an OTV Vitamin D 2000IU/daily as suggested by GYN. I also recommended that she schedule a CPE with Dr. Rogers Blocker since she has not been seen in the office since 10/2018. Pt has agreed to the advice recommended.   Please schedule app with pt.  Thanks

## 2020-05-17 NOTE — Telephone Encounter (Signed)
Lvm for pt to call the office back. 

## 2020-05-18 NOTE — Telephone Encounter (Signed)
Scheduled pt

## 2020-08-05 ENCOUNTER — Ambulatory Visit: Payer: BC Managed Care – PPO | Admitting: Family Medicine

## 2020-08-16 ENCOUNTER — Telehealth: Payer: Self-pay

## 2020-08-16 NOTE — Telephone Encounter (Signed)
Patient cancelled Vitamin D check due to getting it checked at PCP.

## 2020-08-16 NOTE — Telephone Encounter (Signed)
Routing to Dr Talbert Nan for update.  Pt had Vit D Level checked at PCP.  Encounter closed

## 2020-08-18 ENCOUNTER — Other Ambulatory Visit: Payer: Self-pay

## 2020-08-18 ENCOUNTER — Ambulatory Visit: Payer: BC Managed Care – PPO | Admitting: Family Medicine

## 2020-08-18 ENCOUNTER — Encounter: Payer: Self-pay | Admitting: Family Medicine

## 2020-08-18 VITALS — BP 110/60 | HR 74 | Temp 97.6°F | Ht 63.75 in | Wt 152.6 lb

## 2020-08-18 DIAGNOSIS — E559 Vitamin D deficiency, unspecified: Secondary | ICD-10-CM | POA: Diagnosis not present

## 2020-08-18 LAB — VITAMIN D 25 HYDROXY (VIT D DEFICIENCY, FRACTURES): Vit D, 25-Hydroxy: 34 ng/mL (ref 30–100)

## 2020-08-18 NOTE — Progress Notes (Signed)
Patient: Cathy Hernandez MRN: [id number] DOB: 1971/04/02 PCP: Orma Flaming, MD     Subjective:  Chief Complaint  Patient presents with  . Vitamin D    Pt would like to discuss    HPI: The patient is a 49 y.o. female who presents today to discuss Vitamin D. She has a history of low D that I repleted a couple of years ago. She never started the daily otc D3. She saw her GYN in August and her vitamin D was 17.8. She has been on 2000IU since that time. She wants to recheck this today. Annual labs all done by gyn and to goal except vitamin d.   a1c was also 5.6.   She has had covid vaccines. She also needs her flu shot. She will think about this.   Review of Systems  Constitutional: Negative for chills, fatigue and fever.  HENT: Negative for congestion, dental problem, ear pain, hearing loss, sore throat and trouble swallowing.   Eyes: Negative for visual disturbance.  Respiratory: Negative for cough, chest tightness and shortness of breath.   Cardiovascular: Negative for chest pain, palpitations and leg swelling.  Gastrointestinal: Negative for abdominal pain, blood in stool, diarrhea and nausea.  Endocrine: Negative for cold intolerance, polydipsia, polyphagia and polyuria.  Genitourinary: Negative for dysuria, frequency, hematuria and pelvic pain.  Musculoskeletal: Negative for arthralgias.  Skin: Negative for rash.  Neurological: Negative for dizziness, light-headedness and headaches.  Psychiatric/Behavioral: Negative for dysphoric mood and sleep disturbance. The patient is not nervous/anxious.     Allergies Patient has No Known Allergies.  Past Medical History Patient  has a past medical history of Abnormal EKG (09/2012), Anemia, Chest pain, Dyspnea, Fibroid uterus, GERD (gastroesophageal reflux disease), and SVD (spontaneous vaginal delivery).  Surgical History Patient  has a past surgical history that includes No past surgeries; Upper gi endoscopy; Colonoscopy;  Total laparoscopic hysterectomy with salpingectomy (Bilateral, 09/22/2018); and Cystoscopy (N/A, 09/22/2018).  Family History Pateint's family history includes Cirrhosis in her mother; Colon cancer in her father; Diabetes in her father and mother; Heart disease in her maternal grandmother; Hypertension in her father; Osteoporosis in her mother; Stroke (age of onset: 83) in her mother.  Social History Patient  reports that she has never smoked. She has never used smokeless tobacco. She reports that she does not drink alcohol and does not use drugs.    Objective: Vitals:   08/18/20 1405  BP: 110/60  Pulse: 74  Temp: 97.6 F (36.4 C)  TempSrc: Temporal  SpO2: 99%  Weight: 152 lb 9.6 oz (69.2 kg)  Height: 5' 3.75" (1.619 m)    Body mass index is 26.4 kg/m.  Physical Exam Vitals reviewed.  Constitutional:      Appearance: Normal appearance. She is well-developed and normal weight.  HENT:     Head: Normocephalic and atraumatic.     Right Ear: Tympanic membrane, ear canal and external ear normal.     Left Ear: Tympanic membrane, ear canal and external ear normal.  Eyes:     Extraocular Movements: Extraocular movements intact.     Conjunctiva/sclera: Conjunctivae normal.     Pupils: Pupils are equal, round, and reactive to light.  Neck:     Thyroid: No thyromegaly.     Vascular: No carotid bruit.  Cardiovascular:     Rate and Rhythm: Normal rate and regular rhythm.     Pulses: Normal pulses.     Heart sounds: Normal heart sounds. No murmur heard.   Pulmonary:  Effort: Pulmonary effort is normal.     Breath sounds: Normal breath sounds.  Abdominal:     General: Abdomen is flat. Bowel sounds are normal. There is no distension.     Palpations: Abdomen is soft.     Tenderness: There is no abdominal tenderness.  Musculoskeletal:     Cervical back: Normal range of motion and neck supple.  Lymphadenopathy:     Cervical: No cervical adenopathy.  Skin:    General: Skin is  warm and dry.     Capillary Refill: Capillary refill takes less than 2 seconds.     Findings: No rash.  Neurological:     General: No focal deficit present.     Mental Status: She is alert and oriented to person, place, and time.     Cranial Nerves: No cranial nerve deficit.     Coordination: Coordination normal.     Deep Tendon Reflexes: Reflexes normal.  Psychiatric:        Mood and Affect: Mood normal.        Behavior: Behavior normal.        Assessment/plan: 1. Vitamin D deficiency Recheck today. Likely will need high dose replacement.  - VITAMIN D 25 Hydroxy (Vit-D Deficiency, Fractures); Future -discussed a1c of 5.6 encouraged exercise and low sugar/ low carb diet.  -due for cscope next year. Needs mmg as well. Will set this up in [place].     This visit occurred during the SARS-CoV-2 public health emergency.  Safety protocols were in place, including screening questions prior to the visit, additional usage of staff PPE, and extensive cleaning of exam room while observing appropriate contact time as indicated for disinfecting solutions.     Return if symptoms worsen or fail to improve.   Orma Flaming, MD [place]   08/18/2020

## 2020-08-18 NOTE — Patient Instructions (Addendum)
°  1) vitamin D3 2000IU/day 2) vitamin C: 1000mg /.day 3) quercetin: 500mg  daily 4) zinc 50mg /day  4) melatonin 6mg  before bedtime (may cause drowsiness)  Gargle mouthwash like scope/act/crest twice a day.   Call me right away if you get covid/set up telehealth appointment so we can discuss treatment.     Make sure and check a1c (for diabetes) next august. Really try to stick to low sugar/low carb diet and exercise!   Overall you look great.   Colonoscopy next year and get mammogram!

## 2020-08-19 ENCOUNTER — Other Ambulatory Visit: Payer: Self-pay

## 2021-05-08 ENCOUNTER — Encounter: Payer: Self-pay | Admitting: Hematology

## 2021-05-15 ENCOUNTER — Encounter: Payer: Self-pay | Admitting: Hematology

## 2021-05-15 ENCOUNTER — Encounter: Payer: Self-pay | Admitting: Obstetrics and Gynecology

## 2021-05-15 ENCOUNTER — Ambulatory Visit (INDEPENDENT_AMBULATORY_CARE_PROVIDER_SITE_OTHER): Payer: BC Managed Care – PPO | Admitting: Obstetrics and Gynecology

## 2021-05-15 ENCOUNTER — Ambulatory Visit: Payer: BC Managed Care – PPO | Admitting: Obstetrics and Gynecology

## 2021-05-15 ENCOUNTER — Other Ambulatory Visit: Payer: Self-pay

## 2021-05-15 VITALS — BP 110/84 | HR 76 | Ht 64.0 in | Wt 160.0 lb

## 2021-05-15 DIAGNOSIS — Z8639 Personal history of other endocrine, nutritional and metabolic disease: Secondary | ICD-10-CM

## 2021-05-15 DIAGNOSIS — Z1211 Encounter for screening for malignant neoplasm of colon: Secondary | ICD-10-CM

## 2021-05-15 DIAGNOSIS — Z833 Family history of diabetes mellitus: Secondary | ICD-10-CM

## 2021-05-15 DIAGNOSIS — Z01419 Encounter for gynecological examination (general) (routine) without abnormal findings: Secondary | ICD-10-CM

## 2021-05-15 DIAGNOSIS — Z800 Family history of malignant neoplasm of digestive organs: Secondary | ICD-10-CM | POA: Diagnosis not present

## 2021-05-15 DIAGNOSIS — Z0000 Encounter for general adult medical examination without abnormal findings: Secondary | ICD-10-CM | POA: Diagnosis not present

## 2021-05-15 NOTE — Progress Notes (Signed)
50 y.o. [id number] Married Black or [place] American Not Hispanic or Latino female here for annual exam.  H/O TLH/BS in 12/19.  She is having some increase in hot flashes. No vaginal bleeding. No dyspareunia. No bowel or bladder c/o.    Patient's last menstrual period was 09/23/2018 (lmp unknown).          Sexually active: Yes.    The current method of family planning is status post hysterectomy.    Exercising: Yes.     Walking  Smoker:  no  Health Maintenance: Pap:   06/26/2018 WNL, 04-11-16 WNL NEG HR HPV History of abnormal Pap:  yes 20+ years ago  MMG:  10/06/2018 Birads 2 benign   BMD:   never  Colonoscopy: 05-09-16 diverticulosis, otherwise normal. Due for f/u in 5 years (+FH) TDaP:  06/10/15  Gardasil: n/a   reports that she has never smoked. She has never used smokeless tobacco. 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 2 grown daughters, 1 granddaughter (12, starting 7th grade)  Past Medical History:  Diagnosis Date   Abnormal EKG 09/2012   2013 r/t anemia, Note: EKG 08/2018 WNL and Stress test 08/2018 Normal   Anemia    Chest pain    December, 2013- r/t anemia   Dyspnea    Fibroid uterus    GERD (gastroesophageal reflux disease)    diet controlled, no meds   SVD (spontaneous vaginal delivery)    x 2    Past Surgical History:  Procedure Laterality Date   COLONOSCOPY     CYSTOSCOPY N/A 09/22/2018   Procedure: CYSTOSCOPY;  Surgeon: Salvadore Dom, MD;  Location: [facility];  Service: Gynecology;  Laterality: N/A;   NO PAST SURGERIES     TOTAL LAPAROSCOPIC HYSTERECTOMY WITH SALPINGECTOMY Bilateral 09/22/2018   Procedure: TOTAL LAPAROSCOPIC HYSTERECTOMY WITH SALPINGECTOMY;  Surgeon: Salvadore Dom, MD;  Location: [facility];  Service: Gynecology;  Laterality: Bilateral;  extended recovery bed   UPPER GI ENDOSCOPY      Current Outpatient Medications  Medication Sig Dispense  Refill   cholecalciferol (VITAMIN D3) 25 MCG (1000 UNIT) tablet Take 1,000 Units by mouth daily.     No current facility-administered medications for this visit.    Family History  Problem Relation Age of Onset   Cirrhosis Mother    Diabetes Mother    Stroke Mother 51   Osteoporosis Mother    Heart disease Maternal Grandmother    Colon cancer Father    Diabetes Father    Hypertension Father     Review of Systems  All other systems reviewed and are negative.  Exam:   BP 110/84   Pulse 76   Ht '5\' 4"'$  (1.626 m)   Wt 160 lb (72.6 kg)   LMP 09/23/2018 (LMP Unknown)   SpO2 100%   BMI 27.46 kg/m   Weight change: '@WEIGHTCHANGE'$ @ Height:   Height: '5\' 4"'$  (162.6 cm)  Ht Readings from Last 3 Encounters:  05/15/21 '5\' 4"'$  (1.626 m)  08/18/20 5' 3.75" (1.619 m)  05/12/20 5' 3.75" (1.619 m)    General appearance: alert, cooperative and appears stated age Head: Normocephalic, without obvious abnormality, atraumatic Neck: no adenopathy, supple, symmetrical, trachea midline and thyroid normal to inspection and palpation Lungs: clear to auscultation bilaterally Cardiovascular: regular rate and rhythm Breasts: normal appearance, no masses or tenderness Abdomen: soft, non-tender; non distended,  no masses,  no organomegaly Extremities:  extremities normal, atraumatic, no cyanosis or edema Skin: Skin color, texture, turgor normal. No rashes or lesions Lymph nodes: Cervical, supraclavicular, and axillary nodes normal. No abnormal inguinal nodes palpated Neurologic: Grossly normal   Pelvic: External genitalia:  no lesions              Urethra:  normal appearing urethra with no masses, tenderness or lesions              Bartholins and Skenes: normal                 Vagina: normal appearing vagina with normal color and discharge, no lesions              Cervix: absent               Bimanual Exam:  Uterus:  uterus absent              Adnexa: no mass, fullness, tenderness                Rectovaginal: Confirms               Anus:  normal sphincter tone, no lesions  Gae Dry chaperoned for the exam.  1. Well woman exam Mammogram due, she will schedule No pap this year  2. Family history of colon cancer in father Colonoscopy is due - Ambulatory referral to Gastroenterology  3. History of vitamin D deficiency She isn't consistently taking vit d - VITAMIN D 25 Hydroxy (Vit-D Deficiency, Fractures)  4. Laboratory exam ordered as part of routine general medical examination - CBC - Lipid panel - COMPLETE METABOLIC PANEL WITH GFR  5. Family history of diabetes mellitus (DM) - Hemoglobin A1c  6. Colon cancer screening - Ambulatory referral to Gastroenterology

## 2021-05-15 NOTE — Patient Instructions (Signed)
EXERCISE   We recommended that you start or continue a regular exercise program for good health. Physical activity is anything that gets your body moving, some is better than none. The CDC recommends 150 minutes per week of Moderate-Intensity Aerobic Activity and 2 or more days of Muscle Strengthening Activity.  Benefits of exercise are limitless: helps weight loss/weight maintenance, improves mood and energy, helps with depression and anxiety, improves sleep, tones and strengthens muscles, improves balance, improves bone density, protects from chronic conditions such as heart disease, high blood pressure and diabetes and so much more. To learn more visit: https://www.cdc.gov/physicalactivity/index.html  DIET: Good nutrition starts with a healthy diet of fruits, vegetables, whole grains, and lean protein sources. Drink plenty of water for hydration. Minimize empty calories, sodium, sweets. For more information about dietary recommendations visit: https://health.gov/our-work/nutrition-physical-activity/dietary-guidelines and https://www.myplate.gov/  ALCOHOL:  Women should limit their alcohol intake to no more than 7 drinks/beers/glasses of wine (combined, not each!) per week. Moderation of alcohol intake to this level decreases your risk of breast cancer and liver damage.  If you are concerned that you may have a problem, or your friends have told you they are concerned about your drinking, there are many resources to help. A well-known program that is free, effective, and available to all people all over the nation is Alcoholics Anonymous.  Check out this site to learn more: https://www.aa.org/   CALCIUM AND VITAMIN D:  Adequate intake of calcium and Vitamin D are recommended for bone health.  You should be getting between 1000-1200 mg of calcium and 800 units of Vitamin D daily between diet and supplements  PAP SMEARS:  Pap smears, to check for cervical cancer or precancers,  have traditionally been  done yearly, scientific advances have shown that most women can have pap smears less often.  However, every woman still should have a physical exam from her gynecologist every year. It will include a breast check, inspection of the vulva and vagina to check for abnormal growths or skin changes, a visual exam of the cervix, and then an exam to evaluate the size and shape of the uterus and ovaries. We will also provide age appropriate advice regarding health maintenance, like when you should have certain vaccines, screening for sexually transmitted diseases, bone density testing, colonoscopy, mammograms, etc.   MAMMOGRAMS:  All women over 40 years old should have a routine mammogram.   COLON CANCER SCREENING: Now recommend starting at age 45. At this time colonoscopy is not covered for routine screening until 50. There are take home tests that can be done between 45-49.   COLONOSCOPY:  Colonoscopy to screen for colon cancer is recommended for all women at age 50.  We know, you hate the idea of the prep.  We agree, BUT, having colon cancer and not knowing it is worse!!  Colon cancer so often starts as a polyp that can be seen and removed at colonscopy, which can quite literally save your life!  And if your first colonoscopy is normal and you have no family history of colon cancer, most women don't have to have it again for 10 years.  Once every ten years, you can do something that may end up saving your life, right?  We will be happy to help you get it scheduled when you are ready.  Be sure to check your insurance coverage so you understand how much it will cost.  It may be covered as a preventative service at no cost, but you should check   your particular policy.      Breast Self-Awareness Breast self-awareness means being familiar with how your breasts look and feel. It involves checking your breasts regularly and reporting any changes to your health care provider. Practicing breast self-awareness is  important. A change in your breasts can be a sign of a serious medical problem. Being familiar with how your breasts look and feel allows you to find any problems early, when treatment is more likely to be successful. All women should practice breast self-awareness, including women who have had breast implants. How to do a breast self-exam One way to learn what is normal for your breasts and whether your breasts are changing is to do a breast self-exam. To do a breast self-exam: Look for Changes  Remove all the clothing above your waist. Stand in front of a mirror in a room with good lighting. Put your hands on your hips. Push your hands firmly downward. Compare your breasts in the mirror. Look for differences between them (asymmetry), such as: Differences in shape. Differences in size. Puckers, dips, and bumps in one breast and not the other. Look at each breast for changes in your skin, such as: Redness. Scaly areas. Look for changes in your nipples, such as: Discharge. Bleeding. Dimpling. Redness. A change in position. Feel for Changes Carefully feel your breasts for lumps and changes. It is best to do this while lying on your back on the floor and again while sitting or standing in the shower or tub with soapy water on your skin. Feel each breast in the following way: Place the arm on the side of the breast you are examining above your head. Feel your breast with the other hand. Start in the nipple area and make  inch (2 cm) overlapping circles to feel your breast. Use the pads of your three middle fingers to do this. Apply light pressure, then medium pressure, then firm pressure. The light pressure will allow you to feel the tissue closest to the skin. The medium pressure will allow you to feel the tissue that is a little deeper. The firm pressure will allow you to feel the tissue close to the ribs. Continue the overlapping circles, moving downward over the breast until you feel your  ribs below your breast. Move one finger-width toward the center of the body. Continue to use the  inch (2 cm) overlapping circles to feel your breast as you move slowly up toward your collarbone. Continue the up and down exam using all three pressures until you reach your armpit.  Write Down What You Find  Write down what is normal for each breast and any changes that you find. Keep a written record with breast changes or normal findings for each breast. By writing this information down, you do not need to depend only on memory for size, tenderness, or location. Write down where you are in your menstrual cycle, if you are still menstruating. If you are having trouble noticing differences in your breasts, do not get discouraged. With time you will become more familiar with the variations in your breasts and more comfortable with the exam. How often should I examine my breasts? Examine your breasts every month. If you are breastfeeding, the best time to examine your breasts is after a feeding or after using a breast pump. If you menstruate, the best time to examine your breasts is 5-7 days after your period is over. During your period, your breasts are lumpier, and it may be more   difficult to notice changes. When should I see my health care provider? See your health care provider if you notice: A change in shape or size of your breasts or nipples. A change in the skin of your breast or nipples, such as a reddened or scaly area. Unusual discharge from your nipples. A lump or thick area that was not there before. Pain in your breasts. Anything that concerns you. Menopause and Hormone Replacement Therapy Menopause is a normal time of life when menstrual periods stop completely and the ovaries stop producing the female hormones estrogen and progesterone. Low levels of these hormones can affect your health and cause symptoms. Hormonereplacement therapy (HRT) can relieve some of those symptoms. HRT is  the use of artificial (synthetic) hormones to replace hormones that your body has stopped producing because youhave reached menopause. Types of HRT  HRT may consist of the synthetic hormones estrogen and progestin, or it may consist of estrogen-only therapy. You and your health care provider will decidewhich form of HRT is best for you. If you choose to be on HRT and you have a uterus, estrogen and progestin are usually prescribed. Estrogen-only therapy is used for women who do not have auterus. Possible options for taking HRT include: Pills. Patches. Gels. Sprays. Vaginal cream. Vaginal rings. Vaginal inserts. The amount of hormones that you take and how long you take them varies according to your health. It is important to: Begin HRT with the lowest possible dosage. Stop HRT as soon as your health care provider tells you to stop. Work with your health care provider so that you feel informed and comfortable with your decisions. Tell a health care provider about: Any allergies you have. Whether you have had blood clots or know of any risk factors you may have for blood clots. Whether you or family members have had cancer, especially cancer of the breasts, ovaries, or uterus. Any surgeries you have had. All medicines you are taking, including vitamins, herbs, eye drops, creams, and over-the-counter medicines. Whether you are pregnant or may be pregnant. Any medical conditions you have. What are the benefits? HRT can reduce the frequency and severity of menopausal symptoms. Benefits of HRT vary according to the kind of symptoms that you have, how severe they are, and your overall health. HRT may help to improve the following symptoms of menopause: Hot flashes and night sweats. These are sudden feelings of heat that spread over the face and body. The skin may turn red, like a blush. Night sweats are hot flashes that happen while you are sleeping or trying to sleep. Bone loss (osteoporosis).  The body loses calcium more quickly after menopause, causing the bones to become weaker. This can increase the risk for bone breaks (fractures). Vaginal dryness. The lining of the vagina can become thin and dry, which can cause pain during sex or cause infection, burning, or itching. Urinary tract infections. Urinary incontinence. This is the inability to control when you urinate. Irritability. Short-term memory problems. What are the risks? Risks of HRT vary depending on your individual health and medical history. Risks of HRT also depend on whether you receive both estrogen and progestin or you receive estrogen only. HRT may increase the risk of: Spotting. This is when a small amount of blood leaks from the vagina unexpectedly. Endometrial cancer. This cancer is in the lining of the uterus (endometrium). Breast cancer. Increased density of breast tissue. This can make it harder to find breast cancer on a breast X-ray (mammogram). Stroke.  Heart disease. Blood clots. Gallbladder disease or liver disease. Risks of HRT can increase if you have any of the following conditions: Endometrial cancer. Liver disease. Heart disease. Breast cancer. History of blood clots. History of stroke. Follow these instructions at home: Pap tests Have Pap tests done as often as told by your health care provider. A Pap test is sometimes called a Pap smear. It is a screening test that is used to check for signs of cancer of the cervix and vagina. A Pap test can also identify the presence of infection or precancerous changes. Pap tests may be done: Every 3 years, starting at age 32. Every 5 years, starting after age 20, in combination with testing for human papillomavirus (HPV). More often or less often depending on other medical conditions you have, your age, and other risk factors. It is up to you to get the results of your Pap test. Ask your health care provider, or the department that is doing the test, when  your results will be ready. General instructions Take over-the-counter and prescription medicines only as told by your health care provider. Do not use any products that contain nicotine or tobacco. These products include cigarettes, chewing tobacco, and vaping devices, such as e-cigarettes. If you need help quitting, ask your health care provider. Get mammograms, pelvic exams, and medical checkups as often as told by your health care provider. Keep all follow-up visits. This is important. Contact a health care provider if you have: Pain or swelling in your legs. Lumps or changes in your breasts or armpits. Pain, burning, or bleeding when you urinate. Unusual vaginal bleeding. Dizziness or headaches. Pain in your abdomen. Get help right away if you have: Shortness of breath. Chest pain. Slurred speech. Weakness or numbness in any part of your arms or legs. These symptoms may represent a serious problem that is an emergency. Do not wait to see if the symptoms will go away. Get medical help right away. Call your local emergency services (911 in the [place] Do not drive yourself to the hospital. Summary Menopause is a normal time of life when menstrual periods stop completely and the ovaries stop producing the female hormones estrogen and progesterone. HRT can reduce the frequency and severity of menopausal symptoms. Risks of HRT vary depending on your individual health and medical history. This information is not intended to replace advice given to you by your health care provider. Make sure you discuss any questions you have with your healthcare provider. Document Revised: 03/28/2020 Document Reviewed: 03/28/2020 Elsevier Patient Education  [place].

## 2021-05-16 LAB — CBC
HCT: 40.4 % (ref 35.0–45.0)
Hemoglobin: 13.4 g/dL (ref 11.7–15.5)
MCH: 29.5 pg (ref 27.0–33.0)
MCHC: 33.2 g/dL (ref 32.0–36.0)
MCV: 89 fL (ref 80.0–100.0)
MPV: 10.9 fL (ref 7.5–12.5)
Platelets: 267 10*3/uL (ref 140–400)
RBC: 4.54 10*6/uL (ref 3.80–5.10)
RDW: 13.2 % (ref 11.0–15.0)
WBC: 5.3 10*3/uL (ref 3.8–10.8)

## 2021-05-16 LAB — COMPLETE METABOLIC PANEL WITH GFR
AG Ratio: 1.3 (calc) (ref 1.0–2.5)
ALT: 16 U/L (ref 6–29)
AST: 16 U/L (ref 10–35)
Albumin: 4.2 g/dL (ref 3.6–5.1)
Alkaline phosphatase (APISO): 63 U/L (ref 31–125)
BUN: 21 mg/dL (ref 7–25)
CO2: 27 mmol/L (ref 20–32)
Calcium: 9.7 mg/dL (ref 8.6–10.2)
Chloride: 103 mmol/L (ref 98–110)
Creat: 0.91 mg/dL (ref 0.50–0.99)
Globulin: 3.3 g/dL (calc) (ref 1.9–3.7)
Glucose, Bld: 82 mg/dL (ref 65–99)
Potassium: 4.2 mmol/L (ref 3.5–5.3)
Sodium: 139 mmol/L (ref 135–146)
Total Bilirubin: 0.3 mg/dL (ref 0.2–1.2)
Total Protein: 7.5 g/dL (ref 6.1–8.1)
eGFR: 77 mL/min/{1.73_m2} (ref >=60)

## 2021-05-16 LAB — HEMOGLOBIN A1C
Hgb A1c MFr Bld: 5.5 % of total Hgb (ref <5.7)
Mean Plasma Glucose: 111 mg/dL
eAG (mmol/L): 6.2 mmol/L

## 2021-05-16 LAB — LIPID PANEL
Cholesterol: 166 mg/dL (ref <200)
HDL: 59 mg/dL (ref >=50)
LDL Cholesterol (Calc): 89 mg/dL (calc)
Non-HDL Cholesterol (Calc): 107 mg/dL (calc) (ref <130)
Total CHOL/HDL Ratio: 2.8 (calc) (ref <5.0)
Triglycerides: 88 mg/dL (ref <150)

## 2021-05-16 LAB — VITAMIN D 25 HYDROXY (VIT D DEFICIENCY, FRACTURES): Vit D, 25-Hydroxy: 27 ng/mL — ABNORMAL LOW (ref 30–100)

## 2021-05-26 DIAGNOSIS — H8112 Benign paroxysmal vertigo, left ear: Secondary | ICD-10-CM | POA: Insufficient documentation

## 2021-06-12 ENCOUNTER — Encounter: Payer: Self-pay | Admitting: Gastroenterology

## 2021-07-14 ENCOUNTER — Ambulatory Visit: Payer: Self-pay | Admitting: Obstetrics and Gynecology

## 2021-07-25 ENCOUNTER — Encounter: Payer: Self-pay | Admitting: Hematology

## 2021-07-25 ENCOUNTER — Other Ambulatory Visit: Payer: Self-pay | Admitting: Obstetrics and Gynecology

## 2021-07-25 DIAGNOSIS — Z1231 Encounter for screening mammogram for malignant neoplasm of breast: Secondary | ICD-10-CM

## 2021-07-26 ENCOUNTER — Encounter: Payer: Self-pay | Admitting: Hematology

## 2021-07-26 ENCOUNTER — Ambulatory Visit: Payer: BC Managed Care – PPO

## 2021-07-27 ENCOUNTER — Ambulatory Visit: Payer: BC Managed Care – PPO

## 2021-07-27 ENCOUNTER — Encounter: Payer: Self-pay | Admitting: *Deleted

## 2021-07-27 VITALS — Ht 64.0 in | Wt 160.0 lb

## 2021-07-27 DIAGNOSIS — Z1211 Encounter for screening for malignant neoplasm of colon: Secondary | ICD-10-CM

## 2021-07-27 DIAGNOSIS — Z800 Family history of malignant neoplasm of digestive organs: Secondary | ICD-10-CM

## 2021-07-27 MED ORDER — PLENVU 140 G PO SOLR: 1.0000 | ORAL | 0 refills | Status: DC

## 2021-07-27 NOTE — Progress Notes (Signed)

## 2021-08-01 ENCOUNTER — Encounter: Payer: Self-pay | Admitting: Hematology

## 2021-08-02 ENCOUNTER — Ambulatory Visit (AMBULATORY_SURGERY_CENTER): Payer: BC Managed Care – PPO | Admitting: Gastroenterology

## 2021-08-02 ENCOUNTER — Encounter: Payer: Self-pay | Admitting: Gastroenterology

## 2021-08-02 VITALS — BP 130/75 | HR 60 | Temp 97.1°F | Resp 16 | Ht 64.0 in | Wt 160.0 lb

## 2021-08-02 DIAGNOSIS — Z1211 Encounter for screening for malignant neoplasm of colon: Secondary | ICD-10-CM

## 2021-08-02 DIAGNOSIS — Z800 Family history of malignant neoplasm of digestive organs: Secondary | ICD-10-CM | POA: Diagnosis not present

## 2021-08-02 MED ORDER — SODIUM CHLORIDE 0.9 % IV SOLN: 500.0000 mL | Freq: Once | INTRAVENOUS | Status: DC

## 2021-08-02 NOTE — Progress Notes (Signed)
HPI: This is a Cathy Hernandez with FH CRC   ROS: complete GI ROS as described in HPI, all other review negative.  Constitutional:  No unintentional weight loss   Past Medical History:  Diagnosis Date   Abnormal EKG 09/2012   2013 r/t anemia, Note: EKG 08/2018 WNL and Stress test 08/2018 Normal   Anemia    Chest pain    December, 2013- r/t anemia   Dyspnea    Fibroid uterus    GERD (gastroesophageal reflux disease)    diet controlled, no meds   SVD (spontaneous vaginal delivery)    x 2    Past Surgical History:  Procedure Laterality Date   COLONOSCOPY     CYSTOSCOPY N/A 09/22/2018   Procedure: CYSTOSCOPY;  Surgeon: Salvadore Dom, MD;  Location: [facility];  Service: Gynecology;  Laterality: N/A;   TOTAL LAPAROSCOPIC HYSTERECTOMY WITH SALPINGECTOMY Bilateral 09/22/2018   Procedure: TOTAL LAPAROSCOPIC HYSTERECTOMY WITH SALPINGECTOMY;  Surgeon: Salvadore Dom, MD;  Location: [facility];  Service: Gynecology;  Laterality: Bilateral;  extended recovery bed   UPPER GI ENDOSCOPY      Current Outpatient Medications  Medication Sig Dispense Refill   cholecalciferol (VITAMIN D3) 25 MCG (1000 UNIT) tablet Take 1,000 Units by mouth daily.     TURMERIC PO Take 1 tablet by mouth daily.     ibuprofen (ADVIL) 200 MG tablet Take 200 mg by mouth every 6 (six) hours as needed.     meloxicam (MOBIC) 15 MG tablet Take 15 mg by mouth daily.     Current Facility-Administered Medications  Medication Dose Route Frequency Provider Last Rate Last Admin   0.9 %  sodium chloride infusion  500 mL Intravenous Once Milus Banister, MD        Allergies as of 08/02/2021   (No Known Allergies)    Family History  Problem Relation Age of Onset   Cirrhosis Mother    Diabetes Mother    Stroke Mother 62   Osteoporosis Mother    Colon cancer Father    Diabetes Father    Hypertension Father    Heart disease Maternal Grandmother    Esophageal cancer Neg Hx    Stomach cancer Neg Hx    Pancreatic cancer  Neg Hx     Social History   Socioeconomic History   Marital status: Married    Spouse name: n/a   Number of children: 2   Years of education: Master's   Highest education level: Not on file  Occupational History   Occupation: Patent attorney    Comment: [employer]  Tobacco Use   Smoking status: Never   Smokeless tobacco: Never  Vaping Use   Vaping Use: Never used  Substance and Sexual Activity   Alcohol use: No    Alcohol/week: 0.0 standard drinks   Drug use: No   Sexual activity: Yes    Partners: Male    Birth control/protection: Surgical  Other Topics Concern   Not on file  Social History Narrative   Lives with her oldest daughter and granddaughter.   Social Determinants of Health   Financial Resource Strain: Not on file  Food Insecurity: Not on file  Transportation Needs: Not on file  Physical Activity: Not on file  Stress: Not on file  Social Connections: Not on file  Intimate Partner Violence: Not on file     Physical Exam: BP 123/71   Pulse 83   Temp (!) 97.1 F (36.2 C)   Ht 5\' 4"  (1.626  m)   Wt 160 lb (72.6 kg)   LMP 09/23/2018 (LMP Unknown)   SpO2 100%   BMI 27.46 kg/m  Constitutional: generally well-appearing Psychiatric: alert and oriented x3 Lungs: CTA bilaterally Heart: no MCR  Assessment and plan: 50 y.o. female with FH CRC  Screening colonoscoyp today  Care is appropriate for the ambulatory setting.  Owens Loffler, MD [facility] 08/02/2021, 2:48 PM

## 2021-08-02 NOTE — Op Note (Signed)
[place] Patient Name: Cathy Hernandez Procedure Date: 08/02/2021 2:52 PM MRN: [id number] Endoscopist: Milus Banister , MD Age: 50 Referring MD:  Date of Birth: 12-Jun-1971 Gender: Female Account #: [id number] Procedure:                Colonoscopy Indications:              Screening in patient at increased risk: Family                            history of 1st-degree relative with colorectal                            cancer before age 53 years (father) Medicines:                Monitored Anesthesia Care Procedure:                Pre-Anesthesia Assessment:                           - Prior to the procedure, a History and Physical                            was performed, and patient medications and                            allergies were reviewed. The patient's tolerance of                            previous anesthesia was also reviewed. The risks                            and benefits of the procedure and the sedation                            options and risks were discussed with the patient.                            All questions were answered, and informed consent                            was obtained. Prior Anticoagulants: The patient has                            taken no previous anticoagulant or antiplatelet                            agents. ASA Grade Assessment: II - A patient with                            mild systemic disease. After reviewing the risks                            and benefits, the patient was deemed in  satisfactory condition to undergo the procedure.                           After obtaining informed consent, the colonoscope                            was passed under direct vision. Throughout the                            procedure, the patient's blood pressure, pulse, and                            oxygen saturations were monitored continuously. The                            Olympus CF-HQ190L  (Serial# [id number]) Colonoscope was                            introduced through the anus and advanced to the the                            cecum, identified by appendiceal orifice and                            ileocecal valve. The colonoscopy was performed                            without difficulty. The patient tolerated the                            procedure well. The quality of the bowel                            preparation was good. The ileocecal valve,                            appendiceal orifice, and rectum were photographed. Scope In: 2:56:35 PM Scope Out: 3:07:07 PM Scope Withdrawal Time: 0 hours 7 minutes 18 seconds  Total Procedure Duration: 0 hours 10 minutes 32 seconds  Findings:                 The entire examined colon appeared normal on direct                            and retroflexion views. Complications:            No immediate complications. Estimated blood loss:                            None. Estimated Blood Loss:     Estimated blood loss: none. Impression:               - The entire examined colon is normal on direct and  retroflexion views.                           - No polyps or cancers. Recommendation:           - Patient has a contact number available for                            emergencies. The signs and symptoms of potential                            delayed complications were discussed with the                            patient. Return to normal activities tomorrow.                            Written discharge instructions were provided to the                            patient.                           - Resume previous diet.                           - Continue present medications.                           - Repeat colonoscopy in 5 years for screening. Milus Banister, MD 08/02/2021 3:12:05 PM This report has been signed electronically.

## 2021-08-02 NOTE — Progress Notes (Signed)
Pt Drowsy. VSS. To PACU, report to RN. No anesthetic complications noted.  

## 2021-08-02 NOTE — Patient Instructions (Signed)
Discharge instructions given. Normal exam. Resume previous medications. YOU HAD AN ENDOSCOPIC PROCEDURE TODAY AT [facility]:   Refer to the procedure report that was given to you for any specific questions about what was found during the examination.  If the procedure report does not answer your questions, please call your gastroenterologist to clarify.  If you requested that your care partner not be given the details of your procedure findings, then the procedure report has been included in a sealed envelope for you to review at your convenience later.  YOU SHOULD EXPECT: Some feelings of bloating in the abdomen. Passage of more gas than usual.  Walking can help get rid of the air that was put into your GI tract during the procedure and reduce the bloating. If you had a lower endoscopy (such as a colonoscopy or flexible sigmoidoscopy) you may notice spotting of blood in your stool or on the toilet paper. If you underwent a bowel prep for your procedure, you may not have a normal bowel movement for a few days.  Please Note:  You might notice some irritation and congestion in your nose or some drainage.  This is from the oxygen used during your procedure.  There is no need for concern and it should clear up in a day or so.  SYMPTOMS TO REPORT IMMEDIATELY:  Following lower endoscopy (colonoscopy or flexible sigmoidoscopy):  Excessive amounts of blood in the stool  Significant tenderness or worsening of abdominal pains  Swelling of the abdomen that is new, acute  Fever of 100F or higher   For urgent or emergent issues, a gastroenterologist can be reached at any hour by calling [phone]. Do not use MyChart messaging for urgent concerns.    DIET:  We do recommend a small meal at first, but then you may proceed to your regular diet.  Drink plenty of fluids but you should avoid alcoholic beverages for 24 hours.  ACTIVITY:  You should plan to take it easy for the rest of  today and you should NOT DRIVE or use heavy machinery until tomorrow (because of the sedation medicines used during the test).    FOLLOW UP: Our staff will call the number listed on your records 48-72 hours following your procedure to check on you and address any questions or concerns that you may have regarding the information given to you following your procedure. If we do not reach you, we will leave a message.  We will attempt to reach you two times.  During this call, we will ask if you have developed any symptoms of COVID 19. If you develop any symptoms (ie: fever, flu-like symptoms, shortness of breath, cough etc.) before then, please call [phone].  If you test positive for Covid 19 in the 2 weeks post procedure, please call and report this information to [place].    If any biopsies were taken you will be contacted by phone or by letter within the next 1-3 weeks.  Please call us at [phone] if you have not heard about the biopsies in 3 weeks.    SIGNATURES/CONFIDENTIALITY: You and/or your care partner have signed paperwork which will be entered into your electronic medical record.  These signatures attest to the fact that that the information above on your After Visit Summary has been reviewed and is understood.  Full responsibility of the confidentiality of this discharge information lies with you and/or your care-partner.

## 2021-08-03 ENCOUNTER — Ambulatory Visit
Admission: RE | Admit: 2021-08-03 | Discharge: 2021-08-03 | Disposition: A | Discharge status: Home / Self Care | Payer: BC Managed Care – PPO | Source: Ambulatory Visit | Attending: Obstetrics and Gynecology | Admitting: Obstetrics and Gynecology

## 2021-08-03 ENCOUNTER — Other Ambulatory Visit: Payer: Self-pay

## 2021-08-03 DIAGNOSIS — Z1231 Encounter for screening mammogram for malignant neoplasm of breast: Secondary | ICD-10-CM

## 2021-08-04 ENCOUNTER — Telehealth: Payer: Self-pay

## 2021-08-04 NOTE — Telephone Encounter (Signed)
  Follow up Call-  Call back number 08/02/2021  Post procedure Call Back phone  # [phone]  Permission to leave phone message Yes  Some recent data might be hidden     Patient questions:  Do you have a fever, pain , or abdominal swelling? No. Pain Score  0 *  Have you tolerated food without any problems? Yes.    Have you been able to return to your normal activities? Yes.    Do you have any questions about your discharge instructions: Diet   No. Medications  No. Follow up visit  No.  Do you have questions or concerns about your Care? No.  Actions: * If pain score is 4 or above: No action needed, pain <4.

## 2021-08-08 ENCOUNTER — Other Ambulatory Visit: Payer: Self-pay | Admitting: Obstetrics and Gynecology

## 2021-08-08 DIAGNOSIS — R928 Other abnormal and inconclusive findings on diagnostic imaging of breast: Secondary | ICD-10-CM

## 2021-08-21 ENCOUNTER — Encounter: Payer: Self-pay | Admitting: Hematology

## 2021-08-28 ENCOUNTER — Other Ambulatory Visit: Payer: Self-pay

## 2021-08-28 ENCOUNTER — Ambulatory Visit: Payer: BC Managed Care – PPO

## 2021-08-28 ENCOUNTER — Ambulatory Visit
Admission: RE | Admit: 2021-08-28 | Discharge: 2021-08-28 | Disposition: A | Discharge status: Home / Self Care | Payer: 59 | Source: Ambulatory Visit | Attending: Obstetrics and Gynecology | Admitting: Obstetrics and Gynecology

## 2021-08-28 ENCOUNTER — Ambulatory Visit
Admission: RE | Admit: 2021-08-28 | Discharge: 2021-08-28 | Disposition: A | Discharge status: Home / Self Care | Payer: BC Managed Care – PPO | Source: Ambulatory Visit | Attending: Obstetrics and Gynecology | Admitting: Obstetrics and Gynecology

## 2021-08-28 ENCOUNTER — Other Ambulatory Visit: Payer: Self-pay | Admitting: Obstetrics and Gynecology

## 2021-08-28 DIAGNOSIS — R928 Other abnormal and inconclusive findings on diagnostic imaging of breast: Secondary | ICD-10-CM

## 2022-05-21 NOTE — Progress Notes (Signed)
51 y.o. [id number] Married Black or [place] American Not Hispanic or Latino female here for annual exam.  H/O TLH/BS in 12/19. She is having some hot flashes, tolerable. No night sweats. No vaginal dryness.   No bowel or bladder c/o.    Patient's last menstrual period was 09/23/2018 (lmp unknown).          Sexually active: Yes.    The current method of family planning is status post hysterectomy.    Exercising: No.  The patient does not participate in regular exercise at present. Smoker:  no  Health Maintenance: Pap:   06/26/2018 WNL, 04-11-16 WNL NEG HR HPV History of abnormal Pap:  yes 20+ years ago, no surgery on her cervix.   MMG:  08/28/21 Bi-rads 2 benign  BMD:   never  Colonoscopy: 08/02/21 normal f/u 5 years (+FH) TDaP:  06/10/15 Gardasil: n/a   reports that she has never smoked. She has never used smokeless tobacco. She reports that she does not drink alcohol and does not use drugs. Retired Biomedical scientist. 2 grown daughters, 1 granddaughter (12 years old) and 28 month old grandson (they are local). Other daughter is in [place].  She has an air B&B in [place].   Past Medical History:  Diagnosis Date   Abnormal EKG 09/2012   2013 r/t anemia, Note: EKG 08/2018 WNL and Stress test 08/2018 Normal   Anemia    Chest pain    December, 2013- r/t anemia   Dyspnea    Fibroid uterus    GERD (gastroesophageal reflux disease)    diet controlled, no meds   SVD (spontaneous vaginal delivery)    x 2    Past Surgical History:  Procedure Laterality Date   COLONOSCOPY     CYSTOSCOPY N/A 09/22/2018   Procedure: CYSTOSCOPY;  Surgeon: Salvadore Dom, MD;  Location: [facility];  Service: Gynecology;  Laterality: N/A;   TOTAL LAPAROSCOPIC HYSTERECTOMY WITH SALPINGECTOMY Bilateral 09/22/2018   Procedure: TOTAL LAPAROSCOPIC HYSTERECTOMY WITH SALPINGECTOMY;  Surgeon: Salvadore Dom, MD;  Location: [facility];  Service: Gynecology;  Laterality: Bilateral;  extended recovery bed   UPPER GI  ENDOSCOPY      Current Outpatient Medications  Medication Sig Dispense Refill   ibuprofen (ADVIL) 200 MG tablet Take 200 mg by mouth every 6 (six) hours as needed.     No current facility-administered medications for this visit.    Family History  Problem Relation Age of Onset   Cirrhosis Mother    Diabetes Mother    Stroke Mother 74   Osteoporosis Mother    Colon cancer Father    Diabetes Father    Hypertension Father    Heart disease Maternal Grandmother    Esophageal cancer Neg Hx    Stomach cancer Neg Hx    Pancreatic cancer Neg Hx    Breast cancer Neg Hx     Review of Systems  All other systems reviewed and are negative.   Exam:   BP 110/70   Pulse 78   Ht '5\' 4"'$  (1.626 m)   Wt 163 lb (73.9 kg)   LMP 09/23/2018 (LMP Unknown)   SpO2 99%   BMI 27.98 kg/m   Weight change: '@WEIGHTCHANGE'$ @ Height:   Height: '5\' 4"'$  (162.6 cm)  Ht Readings from Last 3 Encounters:  05/28/22 '5\' 4"'$  (1.626 m)  08/02/21 '5\' 4"'$  (1.626 m)  07/27/21 '5\' 4"'$  (1.626 m)    General appearance: alert, cooperative and appears stated age Head: Normocephalic, without obvious abnormality, atraumatic  Neck: no adenopathy, supple, symmetrical, trachea midline and thyroid normal to inspection and palpation Lungs: clear to auscultation bilaterally Cardiovascular: regular rate and rhythm Breasts: normal appearance, no masses or tenderness Abdomen: soft, non-tender; non distended,  no masses,  no organomegaly Extremities: extremities normal, atraumatic, no cyanosis or edema Skin: Skin color, texture, turgor normal. No rashes or lesions Lymph nodes: Cervical, supraclavicular, and axillary nodes normal. No abnormal inguinal nodes palpated Neurologic: Grossly normal   Pelvic: External genitalia:  no lesions              Urethra:  normal appearing urethra with no masses, tenderness or lesions              Bartholins and Skenes: normal                 Vagina: normal appearing vagina with normal color and  discharge, no lesions              Cervix: absent               Bimanual Exam:  Uterus:  uterus absent              Adnexa: no mass, fullness, tenderness               Rectovaginal: Confirms               Anus:  normal sphincter tone, no lesions  Gae Dry, CMA chaperoned for the exam.   1. Well woman exam Discussed breast self exam Discussed calcium and vit D intake Labs UTD with primary Mammogram in 11/23 Colonoscopy UTD No pap today

## 2022-05-28 ENCOUNTER — Ambulatory Visit (INDEPENDENT_AMBULATORY_CARE_PROVIDER_SITE_OTHER): Payer: BC Managed Care – PPO | Admitting: Obstetrics and Gynecology

## 2022-05-28 ENCOUNTER — Encounter: Payer: Self-pay | Admitting: Obstetrics and Gynecology

## 2022-05-28 VITALS — BP 110/70 | HR 78 | Ht 64.0 in | Wt 163.0 lb

## 2022-05-28 DIAGNOSIS — Z01419 Encounter for gynecological examination (general) (routine) without abnormal findings: Secondary | ICD-10-CM

## 2022-05-28 NOTE — Patient Instructions (Signed)

## 2023-05-30 ENCOUNTER — Ambulatory Visit: Payer: BC Managed Care – PPO | Admitting: Obstetrics and Gynecology

## 2023-06-06 ENCOUNTER — Other Ambulatory Visit: Payer: Self-pay | Admitting: Family Medicine

## 2023-06-06 DIAGNOSIS — Z1231 Encounter for screening mammogram for malignant neoplasm of breast: Secondary | ICD-10-CM

## 2023-07-03 ENCOUNTER — Ambulatory Visit: Payer: 59

## 2023-07-17 ENCOUNTER — Encounter: Payer: Self-pay | Admitting: Hematology

## 2023-07-17 ENCOUNTER — Ambulatory Visit
Admission: RE | Admit: 2023-07-17 | Discharge: 2023-07-17 | Disposition: A | Discharge status: Home / Self Care | Payer: BC Managed Care – PPO | Source: Ambulatory Visit | Attending: Family Medicine | Admitting: Family Medicine

## 2023-07-17 DIAGNOSIS — Z1231 Encounter for screening mammogram for malignant neoplasm of breast: Secondary | ICD-10-CM

## 2023-07-22 ENCOUNTER — Other Ambulatory Visit: Payer: Self-pay | Admitting: Family Medicine

## 2023-07-22 DIAGNOSIS — R928 Other abnormal and inconclusive findings on diagnostic imaging of breast: Secondary | ICD-10-CM

## 2023-08-06 NOTE — Progress Notes (Deleted)
52 y.o. N5A2130 Married [place] American female here for annual exam.    PCP: Genella Rife, MD   Patient's last menstrual period was 09/23/2018 (lmp unknown).           Sexually active: {yes no:314532}  The current method of family planning is status post hysterectomy.    Exercising: {yes no:314532}  {types:19826} Smoker:  no  OB History  Gravida Para Term Preterm AB Living  4 2 2   2 2   SAB IAB Ectopic Multiple Live Births          2    # Outcome Date GA Lbr Len/2nd Weight Sex Type Anes PTL Lv  4 AB           3 AB           2 Term      Vag-Spont   LIV  1 Term      Vag-Spont   LIV     Health Maintenance: Pap:   06/26/2018 WNL, 04-11-16 WNL NEG HR HPV  History of abnormal Pap:  yes, 20+ years ago, no surgery on her cervix.  MMG: 07/17/23 Breast Density Cat C, BI-RADS 0 Colonoscopy:   [facility] Colonoscopy          Colonoscopy (Every 5 Years) Next due on 08/02/2026    08/02/2021  COLONOSCOPY   Only the first 1 history entries have been loaded, but more history exists.           BMD:  n/a  Result  n/a  HIV: 06/26/17 NR Hep C: 06/26/17 NR  Immunization History  Administered Date(s) Administered   Influenza,inj,Quad PF,6+ Mos 09/23/2018   Moderna Sars-Covid-2 Vaccination 12/02/2019, 12/30/2019   PPD Test 06/10/2015   Tdap 06/10/2015     {Labs (Optional):23779}   reports that she has never smoked. She has never used smokeless tobacco. She reports that she does not drink alcohol and does not use drugs.  Past Medical History:  Diagnosis Date   Abnormal EKG 09/2012   2013 r/t anemia, Note: EKG 08/2018 WNL and Stress test 08/2018 Normal   Anemia    Chest pain    December, 2013- r/t anemia   Dyspnea    Fibroid uterus    GERD (gastroesophageal reflux disease)    diet controlled, no meds   SVD (spontaneous vaginal delivery)    x 2    Past Surgical History:  Procedure Laterality Date   COLONOSCOPY     CYSTOSCOPY N/A 09/22/2018   Procedure: CYSTOSCOPY;   Surgeon: Romualdo Bolk, MD;  Location: [facility];  Service: Gynecology;  Laterality: N/A;   TOTAL LAPAROSCOPIC HYSTERECTOMY WITH SALPINGECTOMY Bilateral 09/22/2018   Procedure: TOTAL LAPAROSCOPIC HYSTERECTOMY WITH SALPINGECTOMY;  Surgeon: Romualdo Bolk, MD;  Location: [facility];  Service: Gynecology;  Laterality: Bilateral;  extended recovery bed   UPPER GI ENDOSCOPY      Current Outpatient Medications  Medication Sig Dispense Refill   ibuprofen (ADVIL) 200 MG tablet Take 200 mg by mouth every 6 (six) hours as needed.     No current facility-administered medications for this visit.    Family History  Problem Relation Age of Onset   Cirrhosis Mother    Diabetes Mother    Stroke Mother 71   Osteoporosis Mother    Colon cancer Father    Diabetes Father    Hypertension Father    Heart disease Maternal Grandmother    Esophageal cancer Neg Hx    Stomach cancer Neg Hx  Pancreatic cancer Neg Hx    Breast cancer Neg Hx     Review of Systems  Exam:   LMP 09/23/2018 (LMP Unknown)     General appearance: alert, cooperative and appears stated age Head: normocephalic, without obvious abnormality, atraumatic Neck: no adenopathy, supple, symmetrical, trachea midline and thyroid normal to inspection and palpation Lungs: clear to auscultation bilaterally Breasts: normal appearance, no masses or tenderness, No nipple retraction or dimpling, No nipple discharge or bleeding, No axillary adenopathy Heart: regular rate and rhythm Abdomen: soft, non-tender; no masses, no organomegaly Extremities: extremities normal, atraumatic, no cyanosis or edema Skin: skin color, texture, turgor normal. No rashes or lesions Lymph nodes: cervical, supraclavicular, and axillary nodes normal. Neurologic: grossly normal  Pelvic: External genitalia:  no lesions              No abnormal inguinal nodes palpated.              Urethra:  normal appearing urethra with no masses, tenderness or lesions               Bartholins and Skenes: normal                 Vagina: normal appearing vagina with normal color and discharge, no lesions              Cervix: no lesions              Pap taken: {yes no:314532} Bimanual Exam:  Uterus:  normal size, contour, position, consistency, mobility, non-tender              Adnexa: no mass, fullness, tenderness              Rectal exam: {yes no:314532}.  Confirms.              Anus:  normal sphincter tone, no lesions  Chaperone was present for exam:  {BSCHAPERONE:31226::"Jeyden Coffelt F, CMA"}   Assessment and Plan:   ***  Mammogram screening discussed. Self breast awareness reviewed. Guidelines for Calcium, Vitamin D, regular exercise program including cardiovascular and weight bearing exercise.   No follow-ups on file.   Additional counseling given.  {yes [id number]. _______ minutes face to face time of which over 50% was spent in counseling.    After visit summary provided.

## 2023-08-13 ENCOUNTER — Ambulatory Visit
Admission: RE | Admit: 2023-08-13 | Discharge: 2023-08-13 | Disposition: A | Discharge status: Home / Self Care | Payer: BC Managed Care – PPO | Source: Ambulatory Visit | Attending: Family Medicine | Admitting: Family Medicine

## 2023-08-13 DIAGNOSIS — R928 Other abnormal and inconclusive findings on diagnostic imaging of breast: Secondary | ICD-10-CM

## 2023-08-14 ENCOUNTER — Other Ambulatory Visit: Payer: Self-pay | Admitting: Family Medicine

## 2023-08-14 DIAGNOSIS — R921 Mammographic calcification found on diagnostic imaging of breast: Secondary | ICD-10-CM

## 2023-08-20 ENCOUNTER — Ambulatory Visit: Payer: BC Managed Care – PPO | Admitting: Obstetrics and Gynecology

## 2023-10-07 ENCOUNTER — Encounter: Payer: Self-pay | Admitting: Hematology

## 2023-11-19 NOTE — Progress Notes (Unsigned)
 53 y.o. N5A2130 Married [place] American female here for annual exam.    Wants to work on weight loss.   Does notice some increased heat.   Sees dermatology in [place].  Treating a fungal infection on the skin of her back.  She has a follow up appointment on March 31.    Mother has been dealing with illness and had a stroke. Patient helps to care for her.  Has 2 daughters:  42 yo married with 2 children, 72 yo engaged. Married for 5 years.  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   PCP: Genella Rife, MD   Patient's last menstrual period was 09/23/2018 (lmp unknown).           Sexually active: Yes.    The current method of family planning is status post hysterectomy.    Menopausal hormone therapy:  n/a Exercising: No.   Smoker:  no  OB History  Gravida Para Term Preterm AB Living  4 2 2  2 2   SAB IAB Ectopic Multiple Live Births      2    # Outcome Date GA Lbr Len/2nd Weight Sex Type Anes PTL Lv  4 AB           3 AB           2 Term      Vag-Spont   LIV  1 Term      Vag-Spont   LIV     HEALTH MAINTENANCE: Last 2 paps:     06/26/2018 WNL, 04-11-16 WNL NEG HR HPV  History of abnormal Pap or positive HPV:  yes 20+ years ago, no surgery on her cervix.  Mammogram:   08/13/23 Breast Density Cat C, BI-RADS CAT 3 [facility]  She is due in April for a dx left mammogram and is aware.  Colonoscopy:  08/02/21 - due 2027 Bone Density:  n/a  Result  n/a   Immunization History  Administered Date(s) Administered   Influenza,inj,Quad PF,6+ Mos 09/23/2018   Moderna Sars-Covid-2 Vaccination 12/02/2019, 12/30/2019   PPD Test 06/10/2015   Tdap 06/10/2015      reports that she has never smoked. She has never used smokeless tobacco. She reports that she does not drink alcohol and does not use drugs.  Past Medical History:  Diagnosis Date   Abnormal EKG 09/2012   2013 r/t anemia, Note: EKG 08/2018 WNL  and Stress test 08/2018 Normal   Anemia    Chest pain    December, 2013- r/t anemia   Dyspnea    Fibroid uterus    GERD (gastroesophageal reflux disease)    diet controlled, no meds   SVD (spontaneous vaginal delivery)    x 2    Past Surgical History:  Procedure Laterality Date   COLONOSCOPY     CYSTOSCOPY N/A 09/22/2018   Procedure: CYSTOSCOPY;  Surgeon: Romualdo Bolk, MD;  Location: [facility];  Service: Gynecology;  Laterality: N/A;   TOTAL LAPAROSCOPIC HYSTERECTOMY WITH SALPINGECTOMY Bilateral 09/22/2018   Procedure: TOTAL LAPAROSCOPIC HYSTERECTOMY WITH SALPINGECTOMY;  Surgeon: Romualdo Bolk, MD;  Location: [facility];  Service: Gynecology;  Laterality: Bilateral;  extended recovery bed   UPPER GI ENDOSCOPY      Current Outpatient Medications  Medication Sig Dispense Refill   ibuprofen (ADVIL) 200 MG tablet Take 200 mg by mouth every 6 (six) hours as needed.     No current facility-administered medications for this visit.  ALLERGIES: Patient has no known allergies.  Family History  Problem Relation Age of Onset   Cirrhosis Mother    Diabetes Mother    Stroke Mother 86   Osteoporosis Mother    Colon cancer Father    Diabetes Father    Hypertension Father    Heart disease Maternal Grandmother    Esophageal cancer Neg Hx    Stomach cancer Neg Hx    Pancreatic cancer Neg Hx    Breast cancer Neg Hx     Review of Systems  All other systems reviewed and are negative.   PHYSICAL EXAM:  BP 130/86 (BP Location: Left Arm, Patient Position: Sitting, Cuff Size: Small)   Pulse 74   Ht 5\' 4"  (1.626 m)   Wt 165 lb (74.8 kg)   LMP 09/23/2018 (LMP Unknown)   SpO2 97%   BMI 28.32 kg/m     General appearance: alert, cooperative and appears stated age Head: normocephalic, without obvious abnormality, atraumatic Neck: no adenopathy, supple, symmetrical, trachea midline and thyroid normal to inspection and palpation Lungs: clear to auscultation  bilaterally Breasts: right - normal appearance, no masses or tenderness, No nipple retraction or dimpling, No nipple discharge or bleeding, No axillary adenopathy Left - normal appearance, no masses or tenderness, No nipple retraction or dimpling, No nipple discharge or bleeding, axillary cystic change and boils.   Heart: regular rate and rhythm Abdomen: soft, non-tender; no masses, no organomegaly Extremities: extremities normal, atraumatic, no cyanosis or edema Skin: skin color, texture, turgor normal. No rashes or lesions Lymph nodes: cervical, supraclavicular, and axillary nodes normal. Neurologic: grossly normal  Pelvic: External genitalia:  no lesions              No abnormal inguinal nodes palpated.              Urethra:  normal appearing urethra with no masses, tenderness or lesions              Bartholins and Skenes: normal                 Vagina: normal appearing vagina with normal color and discharge, no lesions              Cervix: absent              Pap taken: No. Bimanual Exam:  Uterus:  absent              Adnexa: no mass, fullness, tenderness              Rectal exam: yes.  Confirms.              Anus:  normal sphincter tone, no lesions  Chaperone was present for exam:  Warren Lacy, CMA  ASSESSMENT: Well woman visit with gynecologic exam Hx total laparoscopic hysterectomy with bilateral salpingectomy 12/19 for fibroids. FH colon cancer in father. PHQ2:  0 Left axillary nodules consistent with boils or sebaceous cysts.  Had benign imaging 08/28/21.  See Epic. This may be consistent with sebaceous cysts or hidradenitis.  She has previously had drainage of one of the cysts.   PLAN: Mammogram discussed.  Self breast awareness reviewed. Pap and HRV collected:  No.  Not indicated.  Guidelines for Calcium, Vitamin D, regular exercise program including cardiovascular and weight bearing exercise. Medication refills:  NA. Patient will see her dermatologist regarding her  axillary cysts/boils.  She may need treatment for hidradenitis.  Follow up:  yearly and prn.

## 2023-12-03 ENCOUNTER — Ambulatory Visit (INDEPENDENT_AMBULATORY_CARE_PROVIDER_SITE_OTHER): Payer: 59 | Admitting: Obstetrics and Gynecology

## 2023-12-03 ENCOUNTER — Encounter: Payer: Self-pay | Admitting: Obstetrics and Gynecology

## 2023-12-03 ENCOUNTER — Encounter: Payer: Self-pay | Admitting: Hematology

## 2023-12-03 VITALS — BP 130/86 | HR 74 | Ht 64.0 in | Wt 165.0 lb

## 2023-12-03 DIAGNOSIS — Z1331 Encounter for screening for depression: Secondary | ICD-10-CM | POA: Diagnosis not present

## 2023-12-03 DIAGNOSIS — Z01419 Encounter for gynecological examination (general) (routine) without abnormal findings: Secondary | ICD-10-CM

## 2023-12-03 NOTE — Patient Instructions (Signed)
 EXERCISE AND DIET:  We recommended that you start or continue a regular exercise program for good health. Regular exercise means any activity that makes your heart beat faster and makes you sweat.  We recommend exercising at least 30 minutes per day at least 3 days a week, preferably 4 or 5.  We also recommend a diet low in fat and sugar.  Inactivity, poor dietary choices and obesity can cause diabetes, heart attack, stroke, and kidney damage, among others.    ALCOHOL AND SMOKING:  Women should limit their alcohol intake to no more than 7 drinks/beers/glasses of wine (combined, not each!) per week. Moderation of alcohol intake to this level decreases your risk of breast cancer and liver damage. And of course, no recreational drugs are part of a healthy lifestyle.  And absolutely no smoking or even second hand smoke. Most people know smoking can cause heart and lung diseases, but did you know it also contributes to weakening of your bones? Aging of your skin?  Yellowing of your teeth and nails?  CALCIUM AND VITAMIN D:  Adequate intake of calcium and Vitamin D are recommended.  The recommendations for exact amounts of these supplements seem to change often, but generally speaking 600 mg of calcium (either carbonate or citrate) and 800 units of Vitamin D per day seems prudent. Certain women may benefit from higher intake of Vitamin D.  If you are among these women, your doctor will have told you during your visit.    PAP SMEARS:  Pap smears, to check for cervical cancer or precancers,  have traditionally been done yearly, although recent scientific advances have shown that most women can have pap smears less often.  However, every woman still should have a physical exam from her gynecologist every year. It will include a breast check, inspection of the vulva and vagina to check for abnormal growths or skin changes, a visual exam of the cervix, and then an exam to evaluate the size and shape of the uterus and  ovaries.  And after 52 years of age, a rectal exam is indicated to check for rectal cancers. We will also provide age appropriate advice regarding health maintenance, like when you should have certain vaccines, screening for sexually transmitted diseases, bone density testing, colonoscopy, mammograms, etc.   MAMMOGRAMS:  All women over 18 years old should have a yearly mammogram. Many facilities now offer a "3D" mammogram, which may cost around $50 extra out of pocket. If possible,  we recommend you accept the option to have the 3D mammogram performed.  It both reduces the number of women who will be called back for extra views which then turn out to be normal, and it is better than the routine mammogram at detecting truly abnormal areas.    COLONOSCOPY:  Colonoscopy to screen for colon cancer is recommended for all women at age 52.  We know, you hate the idea of the prep.  We agree, BUT, having colon cancer and not knowing it is worse!!  Colon cancer so often starts as a polyp that can be seen and removed at colonscopy, which can quite literally save your life!  And if your first colonoscopy is normal and you have no family history of colon cancer, most women don't have to have it again for 10 years.  Once every ten years, you can do something that may end up saving your life, right?  We will be happy to help you get it scheduled when you are ready.  Be sure to check your insurance coverage so you understand how much it will cost.  It may be covered as a preventative service at no cost, but you should check your particular policy.    Pocket of Fluid in the Skin (Epidermoid Cyst): What to Know  An epidermoid cyst is a pocket of fluid that can form under your skin. It's filled with thick, oily substance that your skin glands make. These cysts occur anywhere on your body. They're usually harmless and don't cause problems unless they get inflamed or infected. What are the causes? An epidermoid cyst may be  caused by: A blocked pore or hair follicle. An ingrown hair. This is a hair that curls and re-enters the skin instead of growing straight out of the skin. Skin irritation. Skin injuries. Some conditions that are passed from parent to child (inherited). Human papillomavirus (HPV). This is rare but can cause cysts on the bottom of the feet. Long-term (chronic) sun damage to the skin. What increases the risk? Having acne. Being female. Skin injuries. Being past puberty. Certain rare genetic disorders. What are the signs or symptoms? The only sign of this type of cyst may be a small, painless lump under the skin. When an epidermal cyst ruptures, it may become inflamed. Infections are rare, but symptoms may include: Redness. Inflammation. Tenderness. Warmth. Fever. A bad-smelling, grayish-white substance draining from the cyst. Pus draining from the cyst. How is this diagnosed? This condition is diagnosed with a physical exam. Sometimes, a tissue sample (biopsy) may need to be looked at under a microscope or tested for bacteria. You may be referred to a health care provider who specializes in skin care. This provider is called a dermatologist. How is this treated? If a cyst becomes inflamed, treatment may include: Opening and draining the cyst. Antibiotics. Steroid shots to lessen inflammation. Surgery to take out cysts that are large, painful, or could turn into cancer. Do not try to open or squeeze a cyst yourself. Follow these instructions at home: Medicines Take your medicines only as told. If you were given antibiotics, take them as told. Do not stop taking them even if you start to feel better. General instructions Keep the area around your cyst clean and dry. Wear loose, dry clothing. Avoid touching your cyst. Check the area around your cyst every day for signs of infection. Check for: Redness, swelling, or pain. Fluid or blood. Warmth. Pus or a bad smell. Keep all  follow-up visits to make sure the cyst isn't becoming uncomfortable or infected. Contact a health care provider if: You have any signs of infection. Your cyst doesn't get better or gets worse. You get a cyst that looks different from other cysts you've had. You have a fever. You have redness that spreads from the cyst. This information is not intended to replace advice given to you by your health care provider. Make sure you discuss any questions you have with your health care provider. Document Revised: 05/20/2023 Document Reviewed: 05/10/2023 Elsevier Patient Education  2024 [employer]  Hidradenitis Suppurativa Hidradenitis suppurativa is a long-term (chronic) skin disease. It is similar to a severe form of acne, but it affects areas of the body where acne would be unusual, especially areas of the body where skin rubs against skin and becomes moist. These include: Underarms. Groin. Genital area. Buttocks. Upper thighs. Breasts. Hidradenitis suppurativa may start out as small lumps or pimples caused by blocked skin pores, sweat glands, or hair follicles. Pimples may develop into  deep sores that break open (rupture) and drain pus. Over time, affected areas of skin may thicken and become scarred. This condition is rare and does not spread from person to person (non-contagious). What are the causes? The exact cause of this condition is not known. It may be related to: Female and female hormones. An overactive disease-fighting system (immune system). The immune system may over-react to blocked hair follicles or sweat glands and cause swelling and pus-filled sores. What increases the risk? You are more likely to develop this condition if you: Are female. Are 44-40 years old. Have a family history of hidradenitis suppurativa. Have a personal history of acne. Are overweight. Smoke. Take the medicine lithium. What are the signs or symptoms? The first symptoms are usually painful bumps in  the skin, similar to pimples. The condition may get worse over time (progress), or it may only cause mild symptoms. If the disease progresses, symptoms may include: Skin bumps getting bigger and growing deeper into the skin. Bumps rupturing and draining pus. Itchy, infected skin. Skin getting thicker and scarred. Tunnels under the skin (fistulas) where pus drains from a bump. Pain during daily activities, such as pain during walking if your groin area is affected. Emotional problems, such as stress or depression. This condition may affect your appearance and your ability or willingness to wear certain clothes or do certain activities. How is this diagnosed? This condition is diagnosed by a health care provider who specializes in skin conditions (dermatologist). You may be diagnosed based on: Your symptoms and medical history. A physical exam. Testing a pus sample for infection. Blood tests. How is this treated? Your treatment will depend on how severe your symptoms are. The same treatment will not work for everybody with this condition. You may need to try several treatments to find what works best for you. Treatment may include: Cleaning and bandaging (dressing) your wounds as needed. Lifestyle changes, such as new skin care routines. Taking medicines, such as: Antibiotics. Acne medicines. Medicines to reduce the activity of the immune system. A diabetes medicine (metformin). Birth control pills, for women. Steroids to reduce swelling and pain. Working with a mental health care provider, if you experience emotional distress due to this condition. If you have severe symptoms that do not get better with medicine, you may need surgery. Surgery may involve: Using a laser to clear the skin and remove hair follicles. Opening and draining deep sores. Removing the areas of skin that are diseased and scarred. Follow these instructions at home: Medicines  Take over-the-counter and  prescription medicines only as told by your health care provider. If you were prescribed antibiotics, take them as told by your health care provider. Do not stop using the antibiotic even if your condition improves. Skin care If you have open wounds, cover them with a clean dressing as told by your health care provider. Keep wounds clean by washing them gently with soap and water when you bathe. Do not shave the areas where you get hidradenitis suppurativa. Wear loose-fitting clothes. Try to avoid getting overheated or sweaty. If you get sweaty or wet, change into clean, dry clothes as soon as you can. To help relieve pain and itchiness, cover sore areas with a warm, clean washcloth (warm compress) for 5-10 minutes as often as needed. Your healthcare provider may recommend an antiperspirant deodorant that may be gentle on your skin. A daily antiseptic wash to cleanse affected areas may be suggested by your healthcare provider. General instructions Learn as  much as you can about your disease so that you have an active role in your treatment. Work closely with your health care provider to find treatments that work for you. If you are overweight, work with your health care provider to lose weight as recommended. Do not use any products that contain nicotine or tobacco. These products include cigarettes, chewing tobacco, and vaping devices, such as e-cigarettes. If you need help quitting, ask your health care provider. If you struggle with living with this condition, talk with your health care provider or work with a mental health care provider as recommended. Keep all follow-up visits. Where to find more information Hid[employer]enitis Suppur[employer].: www.hs-foundation.org [employer]: InfoExam.si Contact a health care provider if: You have a flare-up of hidradenitis suppurativa. You have a fever or chills. You have trouble controlling your symptoms at home. You have  trouble doing your daily activities because of your symptoms. You have trouble dealing with emotional problems related to your condition. Summary Hidradenitis suppurativa is a long-term (chronic) skin disease. It is similar to a severe form of acne, but it affects areas of the body where acne would be unusual. The first symptoms are usually painful bumps in the skin, similar to pimples. The condition may only cause mild symptoms, or it may get worse over time (progress). If you have open wounds, cover them with a clean dressing as told by your health care provider. Keep wounds clean by washing them gently with soap and water when you bathe. Besides skin care, treatment may include medicines, laser treatment, and surgery. This information is not intended to replace advice given to you by your health care provider. Make sure you discuss any questions you have with your health care provider. Document Revised: 11/15/2021 Document Reviewed: 11/15/2021 Elsevier Patient Education  2024 [employer].

## 2024-03-09 ENCOUNTER — Other Ambulatory Visit: Payer: Self-pay | Admitting: Obstetrics and Gynecology

## 2024-03-09 DIAGNOSIS — R921 Mammographic calcification found on diagnostic imaging of breast: Secondary | ICD-10-CM

## 2024-04-08 ENCOUNTER — Encounter

## 2024-04-15 ENCOUNTER — Ambulatory Visit
Admission: RE | Admit: 2024-04-15 | Discharge: 2024-04-15 | Disposition: A | Discharge status: Home / Self Care | Source: Ambulatory Visit | Attending: Obstetrics and Gynecology | Admitting: Obstetrics and Gynecology

## 2024-04-15 ENCOUNTER — Other Ambulatory Visit: Payer: Self-pay | Admitting: Family Medicine

## 2024-04-15 ENCOUNTER — Encounter: Payer: Self-pay | Admitting: Hematology

## 2024-04-15 ENCOUNTER — Ambulatory Visit: Payer: Self-pay | Admitting: Obstetrics and Gynecology

## 2024-04-15 DIAGNOSIS — Z1231 Encounter for screening mammogram for malignant neoplasm of breast: Secondary | ICD-10-CM

## 2024-04-15 DIAGNOSIS — R921 Mammographic calcification found on diagnostic imaging of breast: Secondary | ICD-10-CM

## 2024-04-17 ENCOUNTER — Other Ambulatory Visit: Payer: Self-pay | Admitting: Obstetrics and Gynecology

## 2024-04-17 DIAGNOSIS — R921 Mammographic calcification found on diagnostic imaging of breast: Secondary | ICD-10-CM

## 2024-07-17 ENCOUNTER — Ambulatory Visit

## 2024-10-19 ENCOUNTER — Encounter

## 2024-10-22 ENCOUNTER — Encounter

## 2024-11-19 ENCOUNTER — Encounter

## 2024-11-24 ENCOUNTER — Encounter

## 2024-12-07 ENCOUNTER — Ambulatory Visit: Payer: 59 | Admitting: Obstetrics and Gynecology
